# Patient Record
Sex: Female | Born: 1985 | Race: Black or African American | Hispanic: No | Marital: Single | Smoking: Former smoker
Health system: Southern US, Community
[De-identification: ages and names within clinical notes are randomized; demographics above are authoritative.]

## PROBLEM LIST (undated history)

## (undated) ENCOUNTER — Inpatient Hospital Stay (HOSPITAL_COMMUNITY): Payer: Self-pay

## (undated) DIAGNOSIS — E739 Lactose intolerance, unspecified: Secondary | ICD-10-CM

## (undated) DIAGNOSIS — IMO0002 Reserved for concepts with insufficient information to code with codable children: Secondary | ICD-10-CM

## (undated) DIAGNOSIS — I10 Essential (primary) hypertension: Secondary | ICD-10-CM

---
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
## 2006-02-28 ENCOUNTER — Emergency Department (HOSPITAL_COMMUNITY): Admission: EM | Admit: 2006-02-28 | Discharge: 2006-03-01 | Payer: Self-pay | Admitting: Emergency Medicine

## 2007-06-28 ENCOUNTER — Emergency Department (HOSPITAL_COMMUNITY): Admission: EM | Admit: 2007-06-28 | Discharge: 2007-06-28 | Payer: Self-pay | Admitting: Emergency Medicine

## 2007-07-01 ENCOUNTER — Emergency Department (HOSPITAL_COMMUNITY): Admission: EM | Admit: 2007-07-01 | Discharge: 2007-07-01 | Payer: Self-pay | Admitting: Emergency Medicine

## 2007-08-29 ENCOUNTER — Inpatient Hospital Stay (HOSPITAL_COMMUNITY): Admission: AD | Admit: 2007-08-29 | Discharge: 2007-08-29 | Payer: Self-pay | Admitting: Obstetrics & Gynecology

## 2007-09-22 ENCOUNTER — Inpatient Hospital Stay (HOSPITAL_COMMUNITY): Admission: AD | Admit: 2007-09-22 | Discharge: 2007-09-22 | Payer: Self-pay | Admitting: Obstetrics & Gynecology

## 2007-10-07 ENCOUNTER — Inpatient Hospital Stay (HOSPITAL_COMMUNITY): Admission: AD | Admit: 2007-10-07 | Discharge: 2007-10-07 | Payer: Self-pay | Admitting: Obstetrics

## 2007-10-08 ENCOUNTER — Ambulatory Visit (HOSPITAL_COMMUNITY): Admission: RE | Admit: 2007-10-08 | Discharge: 2007-10-08 | Payer: Self-pay | Admitting: Obstetrics & Gynecology

## 2007-12-06 ENCOUNTER — Ambulatory Visit (HOSPITAL_COMMUNITY): Admission: RE | Admit: 2007-12-06 | Discharge: 2007-12-06 | Payer: Self-pay | Admitting: Obstetrics & Gynecology

## 2007-12-23 ENCOUNTER — Inpatient Hospital Stay (HOSPITAL_COMMUNITY): Admission: AD | Admit: 2007-12-23 | Discharge: 2007-12-23 | Payer: Self-pay | Admitting: Psychiatry

## 2008-01-29 ENCOUNTER — Ambulatory Visit (HOSPITAL_COMMUNITY): Admission: RE | Admit: 2008-01-29 | Discharge: 2008-01-29 | Payer: Self-pay | Admitting: Obstetrics

## 2008-02-02 ENCOUNTER — Inpatient Hospital Stay (HOSPITAL_COMMUNITY): Admission: AD | Admit: 2008-02-02 | Discharge: 2008-02-02 | Payer: Self-pay | Admitting: Obstetrics

## 2008-02-17 ENCOUNTER — Ambulatory Visit (HOSPITAL_COMMUNITY): Admission: RE | Admit: 2008-02-17 | Discharge: 2008-02-17 | Payer: Self-pay | Admitting: Obstetrics

## 2008-03-17 ENCOUNTER — Ambulatory Visit (HOSPITAL_COMMUNITY): Admission: RE | Admit: 2008-03-17 | Discharge: 2008-03-17 | Payer: Self-pay | Admitting: Obstetrics & Gynecology

## 2008-04-10 ENCOUNTER — Other Ambulatory Visit: Payer: Self-pay | Admitting: Obstetrics & Gynecology

## 2008-04-13 ENCOUNTER — Encounter: Payer: Self-pay | Admitting: Obstetrics & Gynecology

## 2008-04-13 ENCOUNTER — Inpatient Hospital Stay (HOSPITAL_COMMUNITY): Admission: RE | Admit: 2008-04-13 | Discharge: 2008-04-16 | Payer: Self-pay | Admitting: Obstetrics & Gynecology

## 2008-08-13 ENCOUNTER — Emergency Department (HOSPITAL_COMMUNITY): Admission: EM | Admit: 2008-08-13 | Discharge: 2008-08-13 | Payer: Self-pay | Admitting: Family Medicine

## 2008-12-20 ENCOUNTER — Emergency Department (HOSPITAL_COMMUNITY): Admission: EM | Admit: 2008-12-20 | Discharge: 2008-12-20 | Payer: Self-pay | Admitting: Emergency Medicine

## 2009-03-30 ENCOUNTER — Emergency Department (HOSPITAL_COMMUNITY): Admission: EM | Admit: 2009-03-30 | Discharge: 2009-03-30 | Payer: Self-pay | Admitting: Family Medicine

## 2009-04-04 IMAGING — US US OB LIMITED
1 series · 14 of 19 positions shown · non-contrast
Comparison: none

OBSTETRICAL ULTRASOUND:
 This ultrasound exam was performed in the [HOSPITAL] Ultrasound Department.  The OB US report was generated in the AS system, and faxed to the ordering physician.  This report is also available in [REDACTED] PACS.

[Series 1: us ob limited · 0.31mm/px · 14 of 19 slices shown]
[im 1/19]
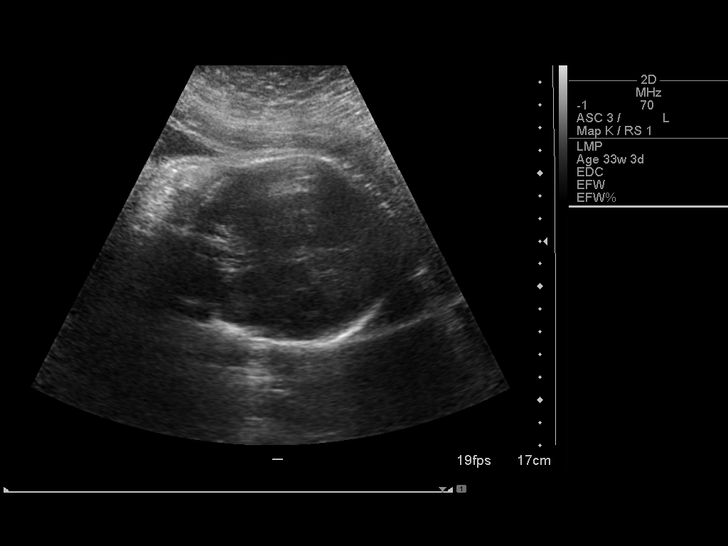
[im 3/19]
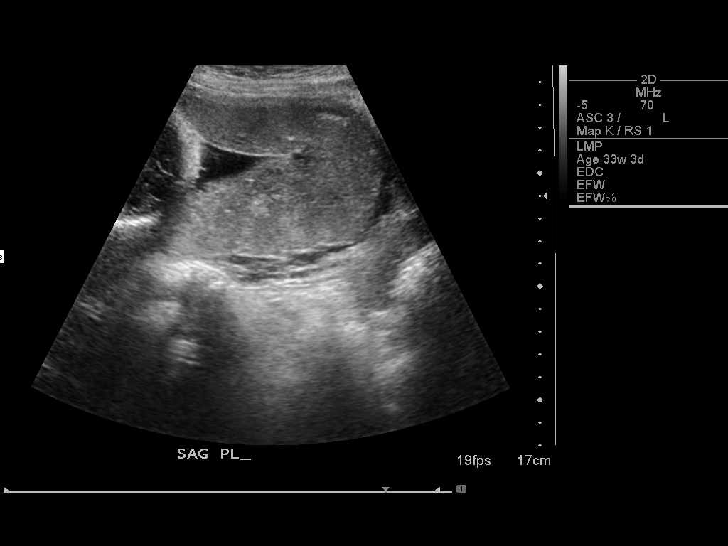
[im 4/19]
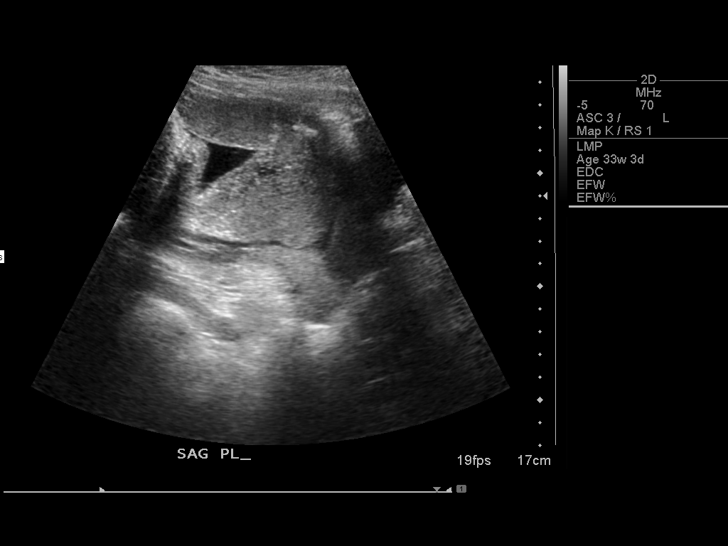
[im 5/19]
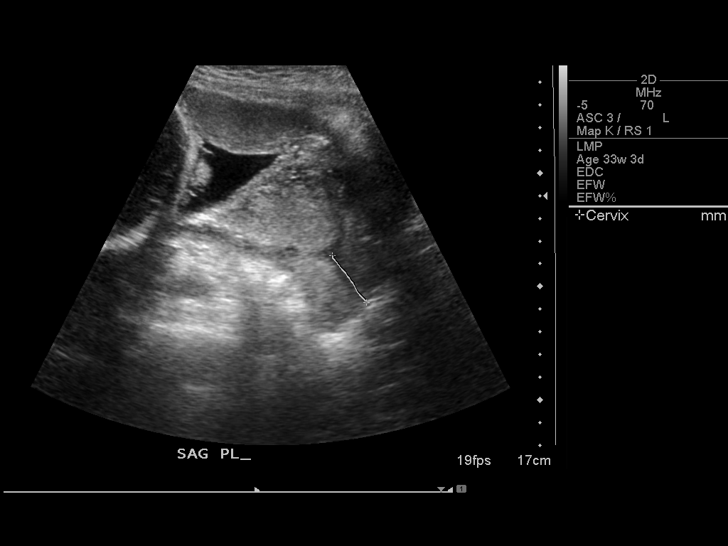
[im 7/19]
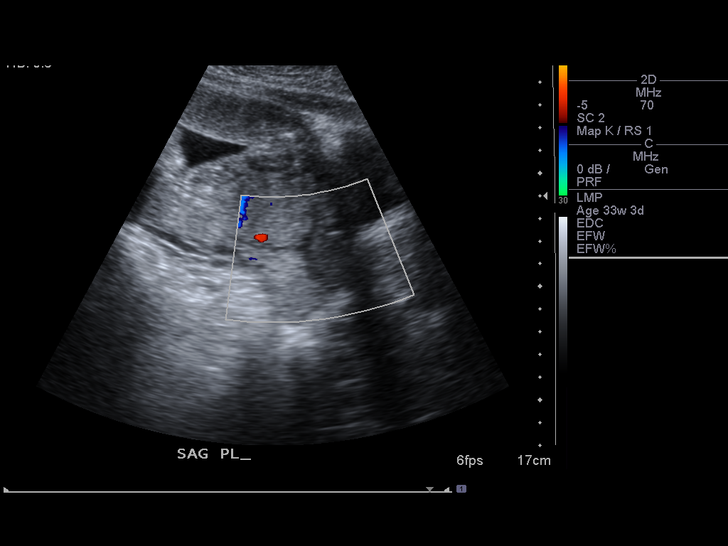
[im 8/19]
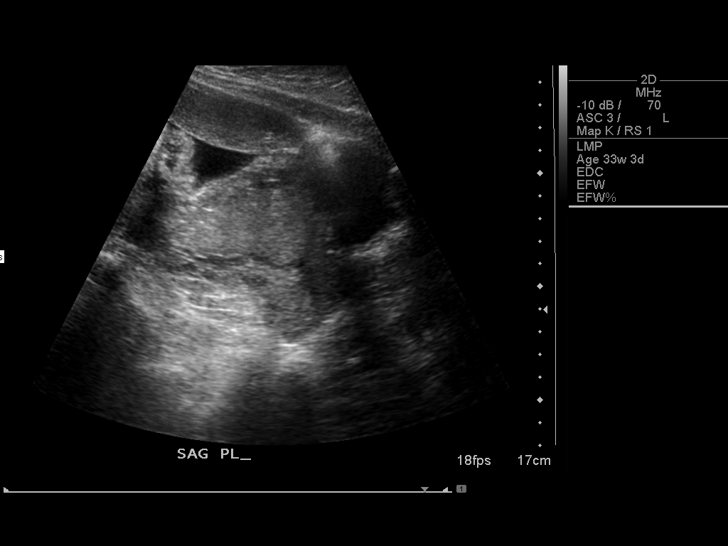
[im 9/19]
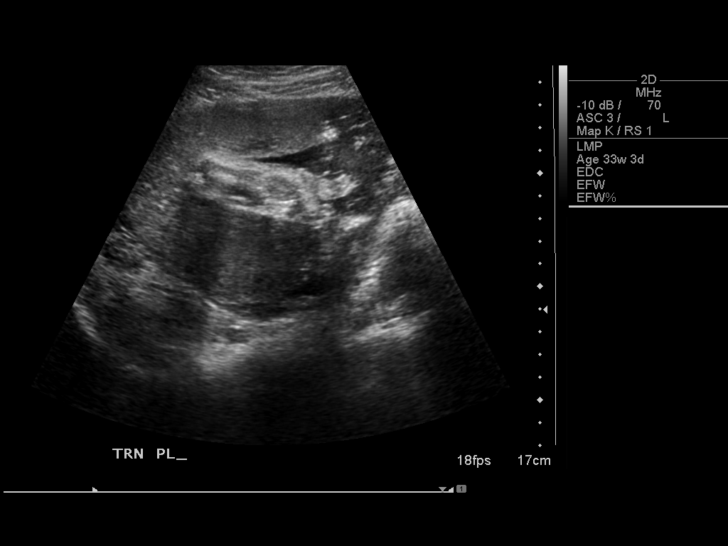
[im 11/19]
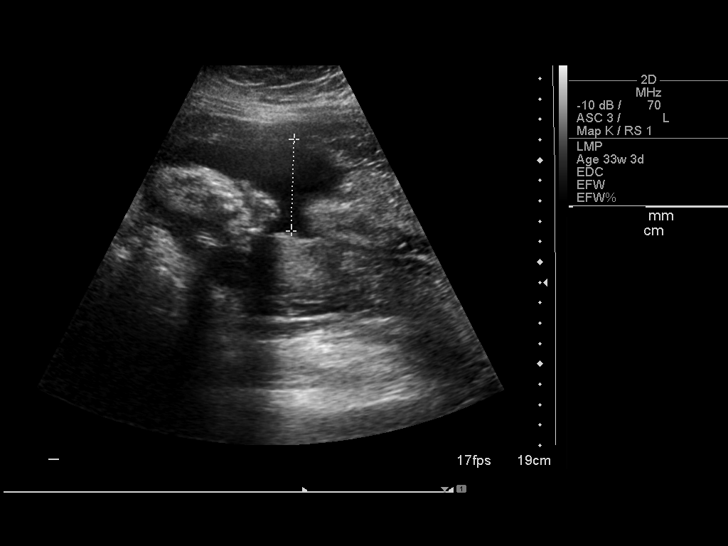
[im 12/19]
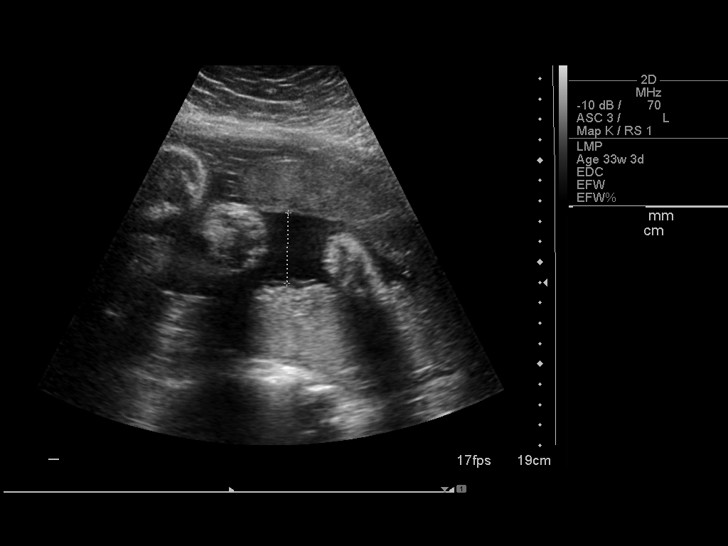
[im 13/19]
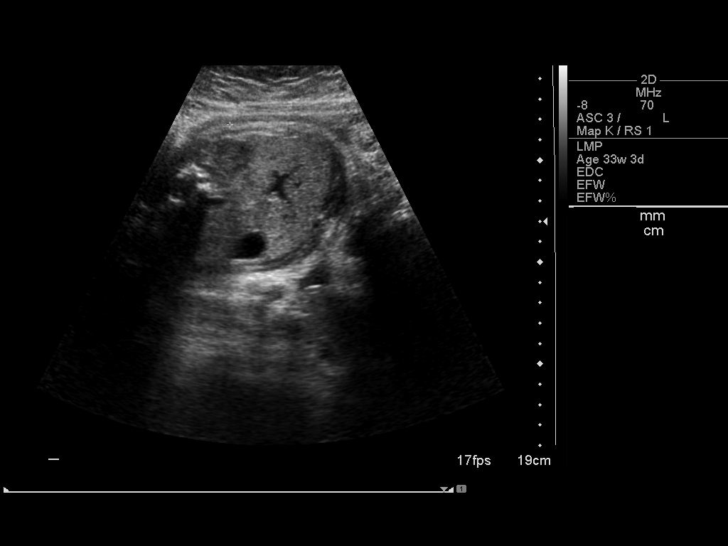
[im 15/19]
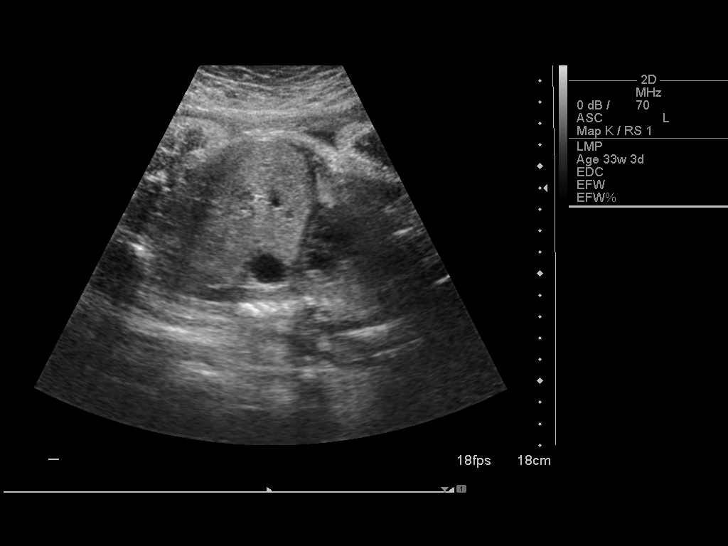
[im 16/19]
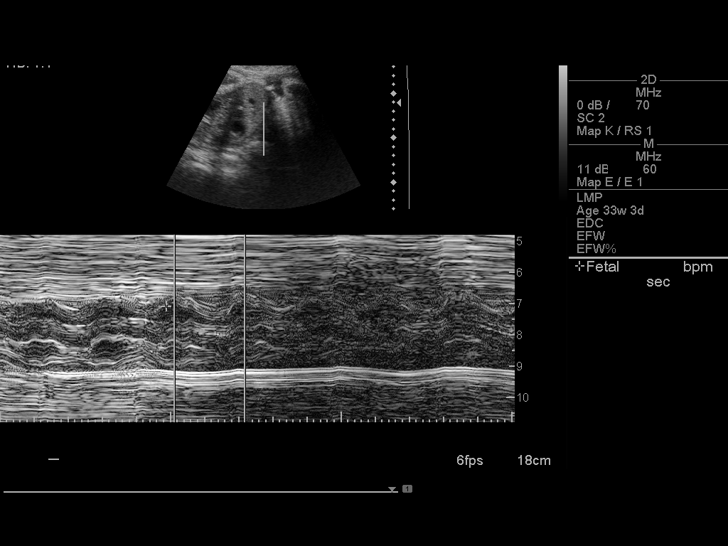
[im 17/19]
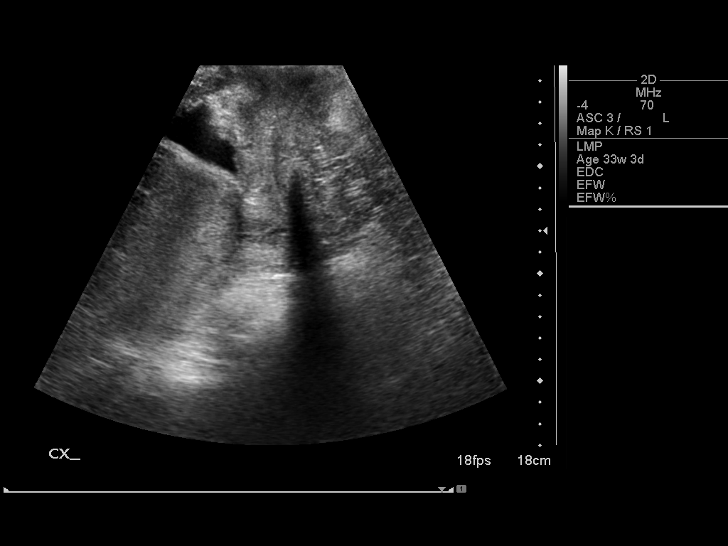
[im 19/19]
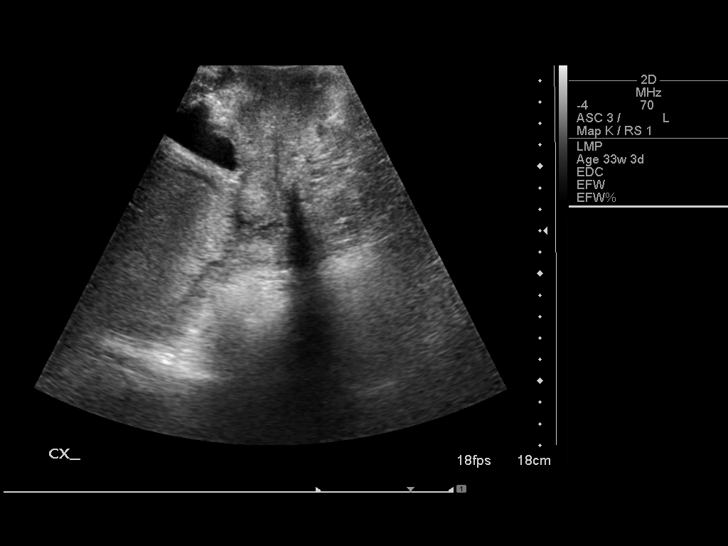

[14 of 19 positions shown; findings below may reference images not displayed]

IMPRESSION: See AS Obstetric US report.

## 2009-05-01 IMAGING — US US FETAL BPP W/O NONSTRESS
1 series · 6 of 6 positions shown · non-contrast
Comparison: none

OBSTETRICAL ULTRASOUND:
 This ultrasound exam was performed in the [HOSPITAL] Ultrasound Department.  The OB US report was generated in the AS system, and faxed to the ordering physician.  This report is also available in [REDACTED] PACS.

[Series 1: us fetal bpp w/o nonstress · 0.30mm/px · 6 acquisitions, 6 frames shown]
[im 1/6]
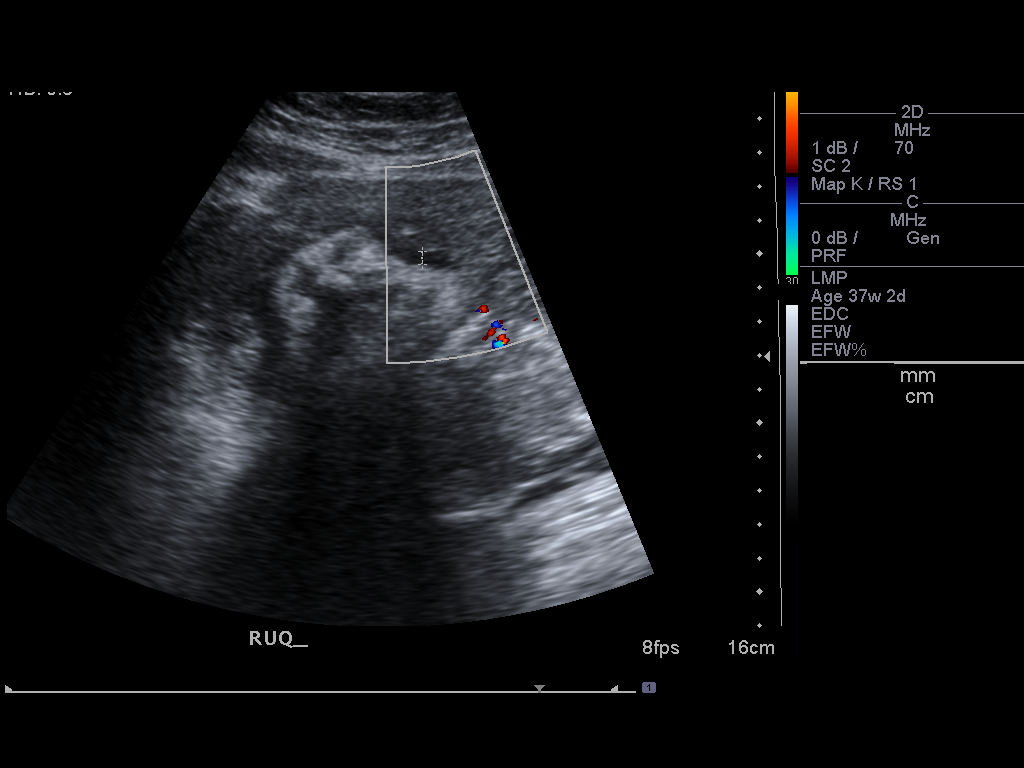
[im 2/6]
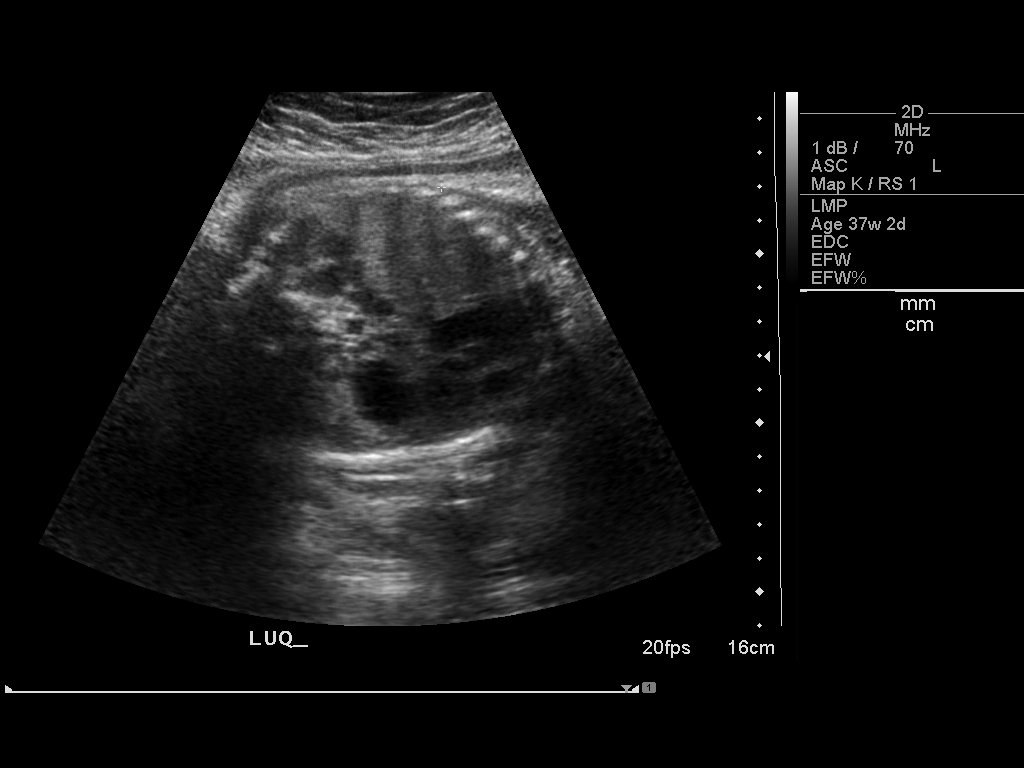
[im 3/6]
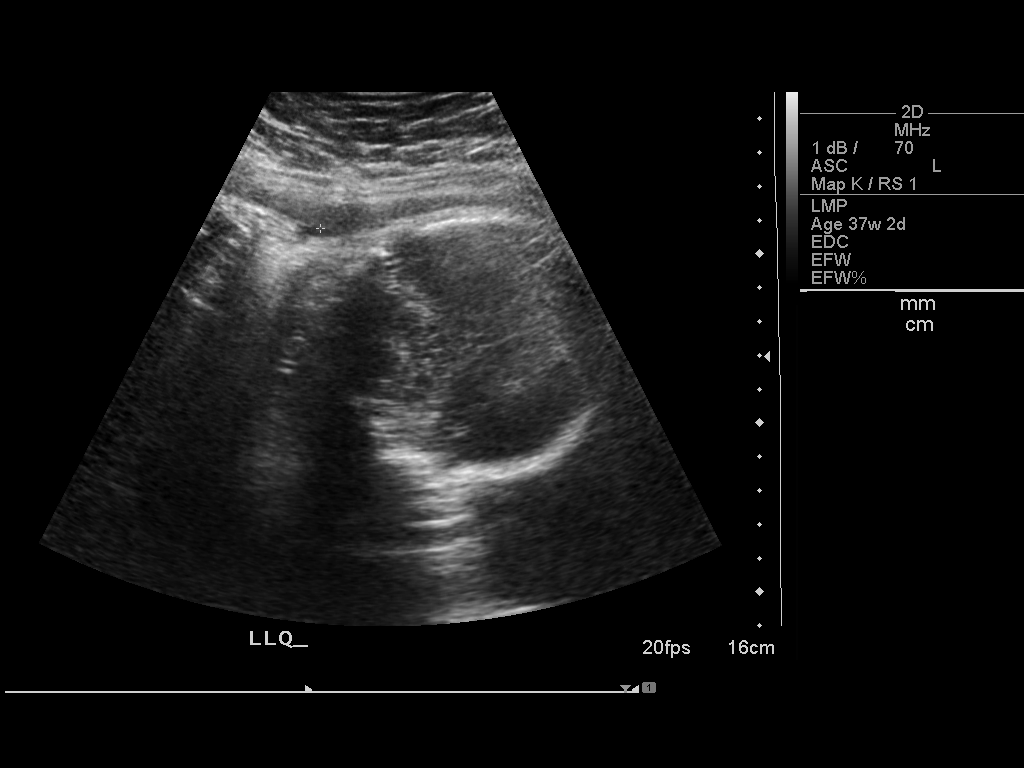
[im 4/6]
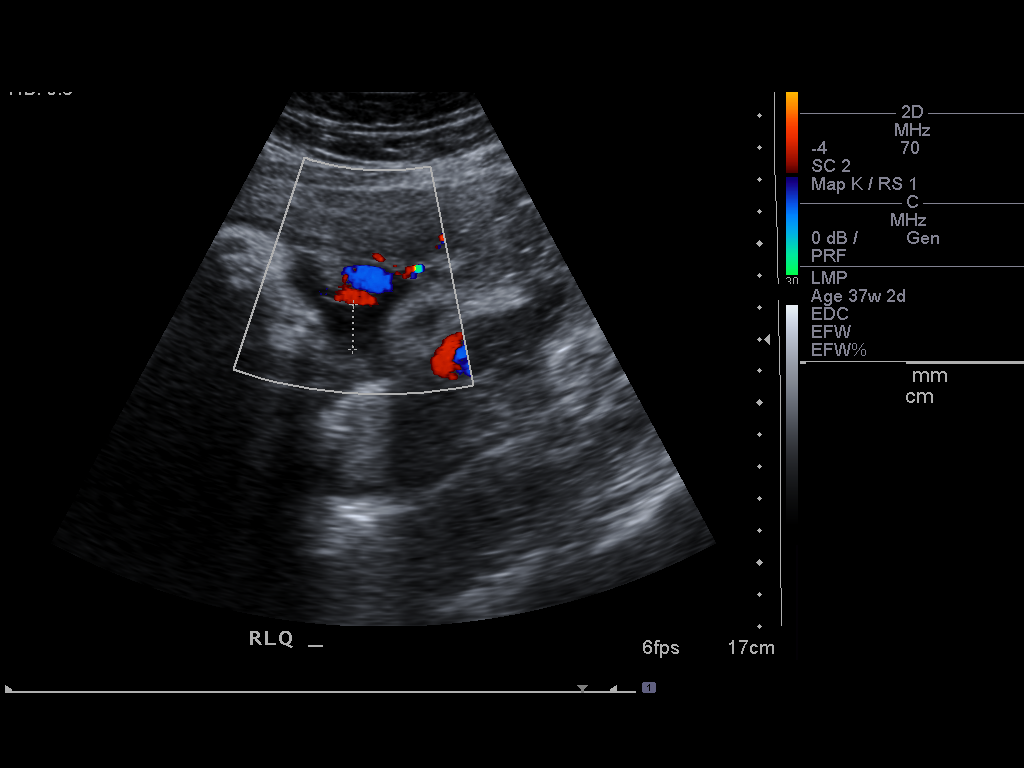
[im 5/6]
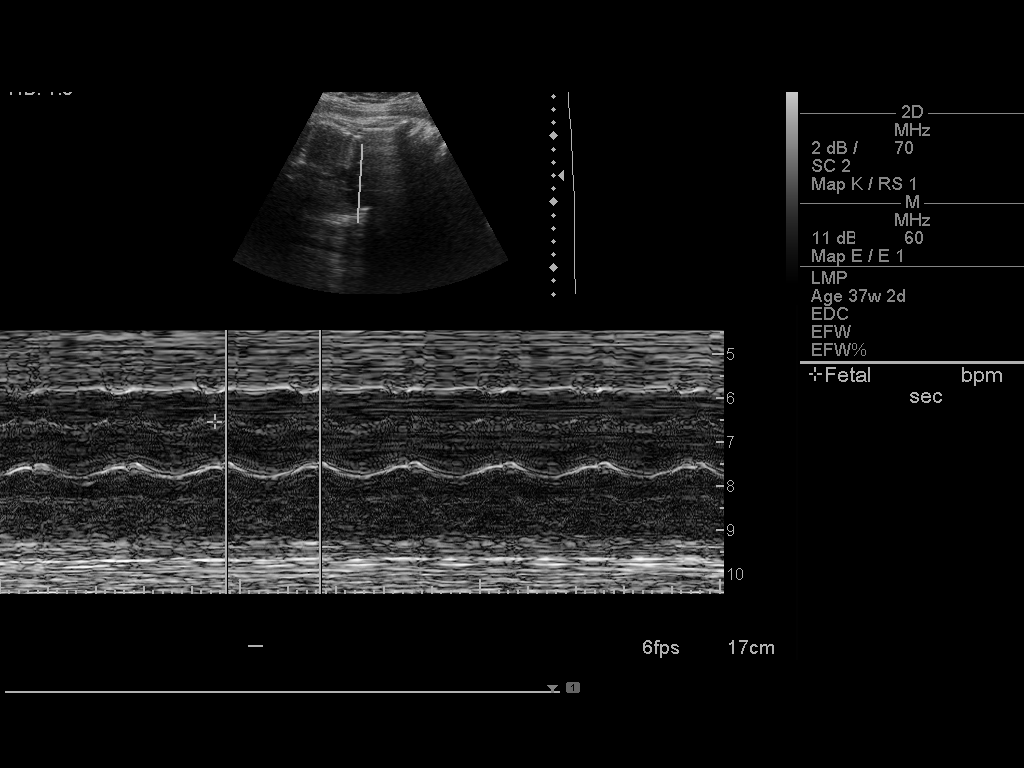
[im 6/6]
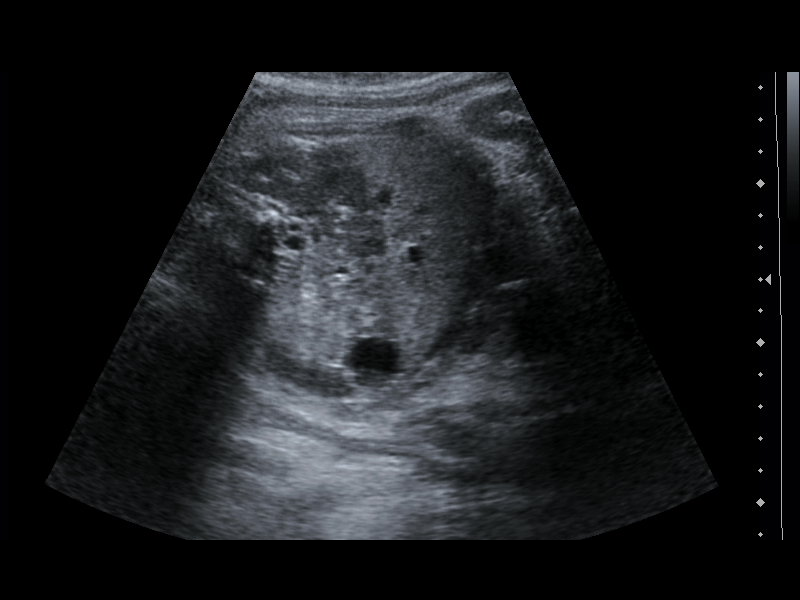

[6 of 6 positions shown; findings below may reference images not displayed]

IMPRESSION: See AS Obstetric US report.

## 2009-05-02 ENCOUNTER — Emergency Department (HOSPITAL_COMMUNITY): Admission: EM | Admit: 2009-05-02 | Discharge: 2009-05-02 | Payer: Self-pay | Admitting: Family Medicine

## 2010-05-23 ENCOUNTER — Encounter: Payer: Self-pay | Admitting: Obstetrics & Gynecology

## 2010-07-17 LAB — POCT PREGNANCY, URINE: Preg Test, Ur: NEGATIVE

## 2010-08-03 LAB — POCT URINALYSIS DIP (DEVICE)
Bilirubin Urine: NEGATIVE
Hgb urine dipstick: NEGATIVE
Protein, ur: NEGATIVE mg/dL
Specific Gravity, Urine: 1.02 (ref 1.005–1.030)
Urobilinogen, UA: 1 mg/dL (ref 0.0–1.0)

## 2010-08-03 LAB — URINE CULTURE

## 2010-08-03 LAB — POCT PREGNANCY, URINE: Preg Test, Ur: NEGATIVE

## 2010-08-10 LAB — POCT URINALYSIS DIP (DEVICE)
Glucose, UA: NEGATIVE mg/dL
Protein, ur: 30 mg/dL — AB
Specific Gravity, Urine: 1.01 (ref 1.005–1.030)
Urobilinogen, UA: 0.2 mg/dL (ref 0.0–1.0)
pH: 8.5 — ABNORMAL HIGH (ref 5.0–8.0)

## 2010-08-10 LAB — POCT PREGNANCY, URINE: Preg Test, Ur: NEGATIVE

## 2010-09-13 NOTE — H&P (Signed)
Leslie Mills, Leslie Mills               ACCOUNT NO.:  [id number]   MEDICAL RECORD NO.:  [id number]          PATIENT TYPE:  INP   LOCATION:  9170                          FACILITY:  [facility]   PHYSICIAN:  Roseanna Rainbow, M.D.DATE OF BIRTH:  Nov 28, 1985   DATE OF ADMISSION:  04/13/2008  DATE OF DISCHARGE:                              HISTORY & PHYSICAL   CHIEF COMPLAINT:  The patient is a 25 year old para 0 with an estimated  date of confinement of May 02, 2008 with an intrauterine pregnancy at  37 weeks with a placenta previa and oligohydramnios.   HISTORY OF PRESENT ILLNESS:  The patient had presented to the radiology  suite for an amniocentesis for fetal lung maturity.  However, an  adequate pocket of fluid was failed to be demonstrated.  The amniotic  fluid index was 3.8.   MEDICATIONS:  Please see the medication reconciliation form.   OB RISK FACTORS:  Placenta previa.   PAST OBSTETRICAL HISTORY:  There is a history of a voluntary termination  of pregnancy.  Borderline elevated blood pressures, rule out gestational  hypertension.   PRENATAL LABS:  Blood type is A positive.  Antibody screen negative and  urine culture and sensitivity no growth,  1-hour GTT 104.  Gardnerella  vaginalis positive in June of 2009.  Hepatitis B surface antigen  negative.  Hematocrit 35.1, hemoglobin 11.7.  HIV nonreactive.  The  platelets 249,000.  RPR nonreactive, rubella immune.  Sickle cell  negative.  Ultrasound on November 17, central previa amniotic fluid  index subjectively low normal of AFI 10.8.  This was a limited study.  Normal cervical length.   PAST GYN HISTORY:  Noncontributory.   PAST MEDICAL HISTORY:  No significant history of medical diseases.   PAST SURGICAL HISTORY:  No previous surgery.   SOCIAL HISTORY:  She works at [employer].  She is single, does not give  any significant history of alcohol usage, has no significant smoking  history.  Denies illicit drug use.   FAMILY HISTORY:  Remarkable for adult-onset diabetes and hypertension.   PHYSICAL EXAMINATION:  VITAL SIGNS:  To be assessed once the patient  reaches the labor and delivery suite.  For an ultrasound findings please  see the above.  GENERAL:  Well-developed, well-nourished, no apparent distress.  ABDOMEN:  Gravid, nontender.  EXTREMITIES:  1+ pretibial edema.  PELVIC EXAM:  Deferred.   ASSESSMENT:  Intrauterine pregnancy at 37 weeks with a placenta previa  and now with oligohydramnios.   PLAN:  Admission, cesarean delivery.      Roseanna Rainbow, M.D.  Electronically Signed     LAJ/MEDQ  D:  04/13/2008  T:  04/13/2008  Job:  [id number]

## 2010-09-13 NOTE — Op Note (Signed)
NAMEETTEL, ALBERGO               ACCOUNT NO.:  [id number]   MEDICAL RECORD NO.:  [id number]          PATIENT TYPE:  INP   LOCATION:  9118                          FACILITY:  [facility]   PHYSICIAN:  Roseanna Rainbow, M.D.DATE OF BIRTH:  [birth year]   DATE OF PROCEDURE:  04/13/2008  DATE OF DISCHARGE:                               OPERATIVE REPORT   PREOPERATIVE DIAGNOSES:  Intrauterine pregnancy at 37 plus weeks.  Oligohydramnios. Placenta previa   POSTOPERATIVE DIAGNOSES:  Intrauterine pregnancy at 37 plus weeks.  Oligohydramnios, placenta previa , double footling breech presentation.   PROCEDURES:  Primary lower uterine elliptical cesarean delivery via  Pfannenstiel skin incision.   SURGEON:  Roseanna Rainbow, M.D.   ANESTHESIA:  Spinal.   ESTIMATED BLOOD LOSS:  600 mL.   COMPLICATIONS:  None.   IV FLUIDS:  As per anesthesiology.   URINE OUTPUT:  As per anesthesiology.   PROCEDURE:  The patient was taken to the operating room with IV running.  She was placed in the dorsal supine position with a leftward tilt and  prepped and draped in the usual sterile fashion.  After a time-out had  been completed, a Pfannenstiel skin incision was made with a scalpel.  This incision was carried down through the fascia.  The fascia was  nicked in the midline.  Fascia was then extended bilaterally.  The  superior aspect of the fascial incision was tented up and the underlying  rectus muscles dissected off.  The inferior aspect of the fascial  incision was manipulated in a similar fashion.  The rectus muscles were  separated in the midline.  The parietoperitoneum was entered bluntly.  The excision was extended bluntly.  The bladder blade was then placed.  The vesicouterine peritoneum was tented up and entered sharply.  This  incision was extended bilaterally and the bladder flap created bluntly.  The bladder blade was then in place.  The lower uterine segment was  incised in a  transverse fashion with the scalpel.  This incision was  then extended bluntly.  A complete breech extraction was then performed.  To facilitate delivery of the head Mariceau-Snellie-Veit maneuver was  performed.  The cord was clamped and cut.  The infant was handed off to  the awaiting neonatologist.  The placenta was then removed.  The  intrauterine cavity was evacuated of the remaining amniotic fluid, clots  and debris with a moist laparotomy sponge.  The uterine incision was  then reapproximated in running and locking fashion using a suture of 0  Monocryl.  A second imbricating layer of the same suture was then  placed.  Bleeding point was then secured with figure-of-eight sutures of  2-0 Vicryl.  The pericolic gutters were then copiously irrigated.  The  parietoperitoneum was closed in a running fashion using suture  of 2-0 Vicryl. The fascia was closed with two sutures meeting in the  midline in a running fashion.  The skin was closed with staples.  At the  close of the procedure, the instrument and pack counts were noted to be  correct x2.  The  patient was taken to the PACU awake and in stable  condition.      Roseanna Rainbow, M.D.  Electronically Signed     LAJ/MEDQ  D:  04/13/2008  T:  04/14/2008  Job:  [id number]

## 2010-09-16 NOTE — Discharge Summary (Signed)
Leslie Mills, Leslie Mills               ACCOUNT NO.:  [id number]   MEDICAL RECORD NO.:  [id number]          PATIENT TYPE:  INP   LOCATION:  9118                          FACILITY:  [facility]   PHYSICIAN:  Charles A. Clearance Coots, M.D.DATE OF BIRTH:  03-Jan-1986   DATE OF ADMISSION:  04/13/2008  DATE OF DISCHARGE:  04/16/2008                               DISCHARGE SUMMARY   ADMITTING DIAGNOSES:  A [redacted] weeks gestation, placenta previa,  oligohydramnios.   DISCHARGE DIAGNOSES:  A [redacted] weeks gestation, placenta previa,  oligohydramnios, status post primary low transverse cesarean section on  April 13, 2008.  Viable female was delivered at 12:32, Apgars of 7 at 1  minute 9 at 5 minutes, weight of 26, 55 g, length of 48.90 cm.  Mother  and infant was home in good condition.   REASON FOR ADMISSION:  A 25 year old para 0 estimated date of  confinement of May 02, 2008 presents at 42 weeks' gestation with  placenta previa and oligohydramnios.  The patient had presented to the  radiology suite for amniocentesis for fetal lung maturity.  However, an  adequate pocket of fluid could not be demonstrated.  The amniotic fluid  index was 3.8 cm.  A decision was made to proceed with cesarean section  delivery for placenta previa and oligohydramnios.   PAST MEDICAL HISTORY:   SURGERY:  None.   ILLNESSES:  None.   MEDICATIONS:  Prenatal vitamins.   ALLERGIES:  No known drug allergies.   SOCIAL HISTORY:  Single.  Negative tobacco, alcohol, or recreational  drug use.   FAMILY HISTORY:  Remarkable for diabetes, hypertension.   PHYSICAL EXAMINATION:  GENERAL:  Well-nourished, well-developed female  in no acute distress.  She is afebrile.  VITAL SIGNS:  Stable.  LUNGS:  Clear to auscultation bilaterally.  HEART:  Regular rate and rhythm.  ABDOMEN:  Gravid, nontender.  PELVIC:  Exam was omitted.   ADMITTING LABS:  Hemoglobin 11, hematocrit 33, white blood cell count  13,300, platelets 242,000.  Comprehensive  metabolic panel was within  normal limits.  RPR was nonreactive.   HOSPITAL COURSE:  The patient underwent primary low transverse cesarean  section on April 13, 2008.  There were no intraoperative  complications.  Postoperative course was uncomplicated, and she was  discharged home on postop day #3 in good condition.   DISCHARGE LABS:  Hemoglobin 8.4, hematocrit 23.9, white blood cell count  13,700, platelets 188,000.   DISCHARGE DISPOSITION:  Medications continue prenatal vitamins.  Iron  was prescribed for anemia.  Percocet and ibuprofen was prescribed for  pain.  Routine written instructions were given for discharge after  cesarean section.  The patient is to call office for followup  appointment in 2 weeks.      Charles A. Clearance Coots, M.D.  Electronically Signed     CAH/MEDQ  D:  06/09/2008  T:  06/10/2008  Job:  [id number]

## 2010-10-11 ENCOUNTER — Emergency Department (HOSPITAL_COMMUNITY): Payer: Self-pay

## 2010-10-11 ENCOUNTER — Emergency Department (HOSPITAL_COMMUNITY)
Admission: EM | Admit: 2010-10-11 | Discharge: 2010-10-11 | Disposition: A | Discharge status: Home / Self Care | Payer: Self-pay | Attending: Emergency Medicine | Admitting: Emergency Medicine

## 2010-10-11 DIAGNOSIS — F17200 Nicotine dependence, unspecified, uncomplicated: Secondary | ICD-10-CM | POA: Insufficient documentation

## 2010-10-11 DIAGNOSIS — R071 Chest pain on breathing: Secondary | ICD-10-CM | POA: Insufficient documentation

## 2010-10-11 DIAGNOSIS — M25519 Pain in unspecified shoulder: Secondary | ICD-10-CM | POA: Insufficient documentation

## 2010-10-11 DIAGNOSIS — K219 Gastro-esophageal reflux disease without esophagitis: Secondary | ICD-10-CM | POA: Insufficient documentation

## 2011-01-20 LAB — POCT PREGNANCY, URINE
Operator id: 294501
Preg Test, Ur: NEGATIVE

## 2011-01-20 LAB — URINALYSIS, ROUTINE W REFLEX MICROSCOPIC
Bilirubin Urine: NEGATIVE
Glucose, UA: NEGATIVE
Specific Gravity, Urine: 1.019
Urobilinogen, UA: 0.2

## 2011-01-20 LAB — URINE MICROSCOPIC-ADD ON

## 2011-01-20 LAB — URINE CULTURE: Colony Count: >=100000

## 2011-01-24 LAB — POCT PREGNANCY, URINE: Preg Test, Ur: POSITIVE

## 2011-01-24 LAB — WET PREP, GENITAL
Clue Cells Wet Prep HPF POC: NONE SEEN
Trich, Wet Prep: NONE SEEN
Yeast Wet Prep HPF POC: NONE SEEN

## 2011-01-24 LAB — URINE MICROSCOPIC-ADD ON: RBC / HPF: NONE SEEN

## 2011-01-24 LAB — HCG, QUANTITATIVE, PREGNANCY: hCG, Beta Chain, Quant, S: 12516 — ABNORMAL HIGH

## 2011-01-24 LAB — URINALYSIS, ROUTINE W REFLEX MICROSCOPIC
Nitrite: POSITIVE — AB
Urobilinogen, UA: 0.2
pH: 5.5

## 2011-01-24 LAB — URINE CULTURE

## 2011-01-24 LAB — CBC
Platelets: 293
WBC: 12.7 — ABNORMAL HIGH

## 2011-01-24 LAB — ABO/RH: ABO/RH(D): A POS

## 2011-01-30 LAB — WET PREP, GENITAL
WBC, Wet Prep HPF POC: NONE SEEN
Yeast Wet Prep HPF POC: NONE SEEN

## 2011-01-30 LAB — URINALYSIS, ROUTINE W REFLEX MICROSCOPIC
Bilirubin Urine: NEGATIVE
Nitrite: NEGATIVE
Specific Gravity, Urine: 1.015
pH: 7.5

## 2011-01-30 LAB — URINE MICROSCOPIC-ADD ON

## 2011-02-03 LAB — COMPREHENSIVE METABOLIC PANEL
ALT: 20 U/L (ref 0–35)
Alkaline Phosphatase: 157 U/L — ABNORMAL HIGH (ref 39–117)
CO2: 23 mEq/L (ref 19–32)
Chloride: 106 mEq/L (ref 96–112)
GFR calc non Af Amer: >60 mL/min (ref >60)
Glucose, Bld: 91 mg/dL (ref 70–99)
Potassium: 3.8 mEq/L (ref 3.5–5.1)
Sodium: 134 mEq/L — ABNORMAL LOW (ref 135–145)

## 2011-02-03 LAB — CBC
HCT: 33 % — ABNORMAL LOW (ref 36.0–46.0)
MCHC: 34.8 g/dL (ref 30.0–36.0)
MCHC: 34.9 g/dL (ref 30.0–36.0)
MCV: 95.8 fL (ref 78.0–100.0)
MCV: 95.9 fL (ref 78.0–100.0)
MCV: 97 fL (ref 78.0–100.0)
Platelets: 242 10*3/uL (ref 150–400)
Platelets: 244 10*3/uL (ref 150–400)
RDW: 13.3 % (ref 11.5–15.5)
RDW: 13.5 % (ref 11.5–15.5)
RDW: 13.6 % (ref 11.5–15.5)
WBC: 13.3 10*3/uL — ABNORMAL HIGH (ref 4.0–10.5)

## 2011-02-03 LAB — LACTATE DEHYDROGENASE: LDH: 125 U/L (ref 94–250)

## 2011-02-03 LAB — RPR: RPR Ser Ql: NONREACTIVE

## 2011-05-30 ENCOUNTER — Emergency Department (HOSPITAL_COMMUNITY)
Admission: EM | Admit: 2011-05-30 | Discharge: 2011-05-30 | Disposition: A | Discharge status: Home / Self Care | Payer: Self-pay | Source: Home / Self Care | Attending: Emergency Medicine | Admitting: Emergency Medicine

## 2011-05-30 NOTE — ED Notes (Signed)
Pt called x 2 

## 2011-05-30 NOTE — ED Notes (Signed)
Called pt x 1

## 2011-05-30 NOTE — ED Notes (Signed)
Pt called x 3 no answer 

## 2011-05-30 NOTE — ED Provider Notes (Signed)
History     CSN: [id number]  Arrival date & time 05/30/11  1027   First MD Initiated Contact with Patient 05/30/11 1029      No chief complaint on file.   (Consider location/radiation/quality/duration/timing/severity/associated sxs/prior treatment) HPI  No past medical history on file.  No past surgical history on file.  No family history on file.  History  Substance Use Topics  . Smoking status: Not on file  . Smokeless tobacco: Not on file  . Alcohol Use: Not on file    OB History    No data available      Review of Systems  Allergies  Review of patient's allergies indicates not on file.  Home Medications  No current outpatient prescriptions on file.  There were no vitals taken for this visit.  Physical Exam  ED Course  Procedures (including critical care time)  Labs Reviewed - No data to display No results found.   No diagnosis found.    MDM  lwbs        Jimmie Molly, MD 05/30/11 [phone]

## 2012-07-25 ENCOUNTER — Other Ambulatory Visit: Payer: Self-pay | Admitting: *Deleted

## 2012-07-25 DIAGNOSIS — IMO0001 Reserved for inherently not codable concepts without codable children: Secondary | ICD-10-CM

## 2012-07-25 DIAGNOSIS — N390 Urinary tract infection, site not specified: Secondary | ICD-10-CM

## 2012-07-25 MED ORDER — NORETHIN ACE-ETH ESTRAD-FE 1-20 MG-MCG(24) PO CHEW: 1.0000 mg | CHEWABLE_TABLET | Freq: Every day | ORAL | Status: DC

## 2012-07-25 MED ORDER — SULFAMETHOXAZOLE-TRIMETHOPRIM 800-160 MG PO TABS: 1.0000 | ORAL_TABLET | Freq: Two times a day (BID) | ORAL | Status: DC

## 2012-07-25 NOTE — Telephone Encounter (Signed)
Forgot to enter pharmacy- Rx reordered

## 2012-07-25 NOTE — Telephone Encounter (Signed)
Pt called to request Rx for UTI- is experiencing burning with urination and urgency. Pt also request Rf on her birth control. Pt states she is up to date on her annual exam. Rx called to pharmacy per office protocol.

## 2012-11-13 ENCOUNTER — Encounter (HOSPITAL_COMMUNITY): Payer: Self-pay

## 2012-11-13 ENCOUNTER — Inpatient Hospital Stay (HOSPITAL_COMMUNITY): Payer: Medicaid Other

## 2012-11-13 ENCOUNTER — Inpatient Hospital Stay (HOSPITAL_COMMUNITY)
Admission: AD | Admit: 2012-11-13 | Discharge: 2012-11-13 | Disposition: A | Discharge status: Home / Self Care | Payer: Medicaid Other | Source: Ambulatory Visit | Attending: Obstetrics & Gynecology | Admitting: Obstetrics & Gynecology

## 2012-11-13 DIAGNOSIS — N760 Acute vaginitis: Secondary | ICD-10-CM

## 2012-11-13 DIAGNOSIS — B9689 Other specified bacterial agents as the cause of diseases classified elsewhere: Secondary | ICD-10-CM | POA: Insufficient documentation

## 2012-11-13 DIAGNOSIS — A499 Bacterial infection, unspecified: Secondary | ICD-10-CM | POA: Insufficient documentation

## 2012-11-13 DIAGNOSIS — O2390 Unspecified genitourinary tract infection in pregnancy, unspecified trimester: Secondary | ICD-10-CM | POA: Insufficient documentation

## 2012-11-13 DIAGNOSIS — R109 Unspecified abdominal pain: Secondary | ICD-10-CM | POA: Insufficient documentation

## 2012-11-13 DIAGNOSIS — N949 Unspecified condition associated with female genital organs and menstrual cycle: Secondary | ICD-10-CM | POA: Insufficient documentation

## 2012-11-13 DIAGNOSIS — O9989 Other specified diseases and conditions complicating pregnancy, childbirth and the puerperium: Secondary | ICD-10-CM

## 2012-11-13 DIAGNOSIS — O26891 Other specified pregnancy related conditions, first trimester: Secondary | ICD-10-CM

## 2012-11-13 HISTORY — DX: Lactose intolerance, unspecified: E73.9

## 2012-11-13 HISTORY — DX: Essential (primary) hypertension: I10

## 2012-11-13 LAB — OB RESULTS CONSOLE GC/CHLAMYDIA
Chlamydia: NEGATIVE
Gonorrhea: NEGATIVE

## 2012-11-13 LAB — WET PREP, GENITAL
WBC, Wet Prep HPF POC: NONE SEEN
Yeast Wet Prep HPF POC: NONE SEEN

## 2012-11-13 LAB — URINALYSIS, ROUTINE W REFLEX MICROSCOPIC
Hgb urine dipstick: NEGATIVE
Ketones, ur: NEGATIVE mg/dL
Leukocytes, UA: NEGATIVE
Protein, ur: NEGATIVE mg/dL
Urobilinogen, UA: 0.2 mg/dL (ref 0.0–1.0)

## 2012-11-13 LAB — CBC
Platelets: 281 10*3/uL (ref 150–400)
RBC: 3.91 MIL/uL (ref 3.87–5.11)
WBC: 13.4 10*3/uL — ABNORMAL HIGH (ref 4.0–10.5)

## 2012-11-13 LAB — POCT PREGNANCY, URINE: Preg Test, Ur: POSITIVE — AB

## 2012-11-13 MED ORDER — METRONIDAZOLE 500 MG PO TABS: 500.0000 mg | ORAL_TABLET | Freq: Two times a day (BID) | ORAL | Status: DC

## 2012-11-13 NOTE — MAU Note (Signed)
Patient states she had a positive home pregnancy test 2 days ago. Has been having abdominal "throbbing" like period trying to start. Has a vaginal discharge with odor. Denies bleeding, nausea or vomiting.

## 2012-11-13 NOTE — MAU Provider Note (Signed)
History     CSN: [id number]  Arrival date and time: 11/13/12 1347   None     Chief Complaint  Patient presents with  . Possible Pregnancy  . Abdominal Pain  . Vaginal Discharge   HPI  Pt is a G1P0 here with report of lower pelvis cramping.  Feels like throbbing and menstruation is about to start.  +vaginal discharge with odor.  Denies bleeding, nausea or vomiting. Patient's last menstrual period was 10/08/2012.    No past medical history on file.  No past surgical history on file.  No family history on file.  History  Substance Use Topics  . Smoking status: Not on file  . Smokeless tobacco: Not on file  . Alcohol Use: Not on file    Allergies: Allergies not on file  Prescriptions prior to admission  Medication Sig Dispense Refill  . Norethin Ace-Eth Estrad-FE (MINASTRIN 24 FE) 1-20 MG-MCG(24) CHEW Chew 1-20 mg by mouth daily.  28 tablet  5  . sulfamethoxazole-trimethoprim (BACTRIM DS,SEPTRA DS) 800-160 MG per tablet Take 1 tablet by mouth 2 (two) times daily.  6 tablet  0    Review of Systems  Gastrointestinal: Positive for abdominal pain.  Genitourinary:       Vaginal discharge  All other systems reviewed and are negative.   Physical Exam   Blood pressure 136/73, pulse 105, temperature 98.5 F (36.9 C), temperature source Oral, resp. rate 16, height 5\' 6"  (1.676 m), weight 90.084 kg (198 lb 9.6 oz), last menstrual period 10/08/2012, SpO2 100.00%.  Physical Exam  Constitutional: She is oriented to person, place, and time. She appears well-developed and well-nourished. No distress.  HENT:  Head: Normocephalic.  Neck: Normal range of motion. Neck supple.  Cardiovascular: Normal rate, regular rhythm and normal heart sounds.   Respiratory: Effort normal and breath sounds normal. No respiratory distress.  GI: Soft. There is no tenderness. There is no CVA tenderness.  Genitourinary: Uterus is enlarged. Cervix exhibits no motion tenderness and no discharge.  Vaginal discharge (white, creamy) found.  Musculoskeletal: Normal range of motion.  Neurological: She is alert and oriented to person, place, and time.  Skin: Skin is warm and dry.  Psychiatric: She has a normal mood and affect.    MAU Course  Procedures  Results for orders placed during the hospital encounter of 11/13/12 (from the past 24 hour(s))  URINALYSIS, ROUTINE W REFLEX MICROSCOPIC     Status: None   Collection Time    11/13/12  2:05 PM      Result Value Range   Color, Urine YELLOW  YELLOW   APPearance CLEAR  CLEAR   Specific Gravity, Urine 1.025  1.005 - 1.030   pH 6.0  5.0 - 8.0   Glucose, UA NEGATIVE  NEGATIVE mg/dL   Hgb urine dipstick NEGATIVE  NEGATIVE   Bilirubin Urine NEGATIVE  NEGATIVE   Ketones, ur NEGATIVE  NEGATIVE mg/dL   Protein, ur NEGATIVE  NEGATIVE mg/dL   Urobilinogen, UA 0.2  0.0 - 1.0 mg/dL   Nitrite NEGATIVE  NEGATIVE   Leukocytes, UA NEGATIVE  NEGATIVE  POCT PREGNANCY, URINE     Status: Abnormal   Collection Time    11/13/12  2:11 PM      Result Value Range   Preg Test, Ur POSITIVE (*) NEGATIVE   Ultrasound: Intrauterine gestational sac: Visualized/normal in shape.  Yolk sac: Present  Embryo: None  MSD: 5.5 mm 5 w 1 d [place] EDC: 07/15/2013  Maternal uterus/adnexae:  Right  ovary measures 3.4 x 2.8 x 2.3 cm. Probable corpus luteum  within the right ovary. Normal appearance of the left ovary  measuring 3.6 x 2.4 x 2.0 cm. No significant subchorionic  hemorrhage. No significant free fluid.  IMPRESSION:  Single intrauterine pregnancy. Gestational age by ultrasound is 5  weeks and 1 day.  Assessment and Plan  Bacterial Vaginosis Abdominal Pain in Early Pregnancy  Plan: DC to home RX Flagyl 500 mg BID x 7 days #14 Schedule prenatal appointment with [facility] as desired.    [facility] 11/13/2012, 3:30 PM

## 2012-11-26 ENCOUNTER — Encounter: Payer: Self-pay | Admitting: Obstetrics

## 2012-12-02 ENCOUNTER — Encounter: Payer: Self-pay | Admitting: Obstetrics & Gynecology

## 2012-12-02 ENCOUNTER — Ambulatory Visit (INDEPENDENT_AMBULATORY_CARE_PROVIDER_SITE_OTHER): Payer: Medicaid Other | Admitting: Obstetrics & Gynecology

## 2012-12-02 VITALS — BP 127/88 | Temp 98.6°F | Wt 190.0 lb

## 2012-12-02 DIAGNOSIS — Z3480 Encounter for supervision of other normal pregnancy, unspecified trimester: Secondary | ICD-10-CM | POA: Insufficient documentation

## 2012-12-02 DIAGNOSIS — Z3481 Encounter for supervision of other normal pregnancy, first trimester: Secondary | ICD-10-CM

## 2012-12-02 DIAGNOSIS — Z98891 History of uterine scar from previous surgery: Secondary | ICD-10-CM | POA: Insufficient documentation

## 2012-12-02 LAB — COMPREHENSIVE METABOLIC PANEL
ALT: 17 U/L (ref 0–35)
AST: 13 U/L (ref 0–37)
Albumin: 4.1 g/dL (ref 3.5–5.2)
Alkaline Phosphatase: 65 U/L (ref 39–117)
Glucose, Bld: 89 mg/dL (ref 70–99)
Potassium: 3.9 mEq/L (ref 3.5–5.3)
Sodium: 133 mEq/L — ABNORMAL LOW (ref 135–145)
Total Protein: 7.1 g/dL (ref 6.0–8.3)

## 2012-12-02 LAB — HIV ANTIBODY (ROUTINE TESTING W REFLEX): HIV: NONREACTIVE

## 2012-12-02 MED ORDER — PRENATAL VITAMINS 0.8 MG PO TABS: 1.0000 | ORAL_TABLET | Freq: Every day | ORAL | Status: DC

## 2012-12-02 MED ORDER — DOXYLAMINE-PYRIDOXINE 10-10 MG PO TBEC: 10.0000 mg | DELAYED_RELEASE_TABLET | Freq: Every evening | ORAL | Status: DC | PRN

## 2012-12-02 NOTE — Progress Notes (Signed)
Pulse-69 GC/CH on 11-13-12 all negative.  Subjective:    Leslie Mills is being seen today for her first obstetrical visit.  This is not a planned pregnancy. She is at [redacted]w[redacted]d gestation. Her obstetrical history is significant for hypertension. Relationship with FOB: significant other, living together. Patient does intend to breast feed. Pregnancy history fully reviewed.  Menstrual History: OB History   Grav Para Term Preterm Abortions TAB SAB Ect Mult Living   3 1 1  1 1    1       Last Pap: 2014 WNL Menarche age: 36 Regular  Patient's last menstrual period was 10/08/2012.    The following portions of the patient's history were reviewed and updated as appropriate: allergies, current medications, past family history, past medical history, past social history, past surgical history and problem list.  Review of Systems Pertinent items are noted in HPI.    Objective:      General Appearance:    Alert, cooperative, no distress, appears stated age  Head:    Normocephalic, without obvious abnormality, atraumatic  Eyes:    PERRL, conjunctiva/corneas clear, EOM's intact, fundi    benign, both eyes  Ears:    Normal TM's and external ear canals, both ears  Nose:   Nares normal, septum midline, mucosa normal, no drainage    or sinus tenderness  Throat:   Lips, mucosa, and tongue normal; teeth and gums normal  Neck:   Supple, symmetrical, trachea midline, no adenopathy;    thyroid:  no enlargement/tenderness/nodules; no carotid   bruit or JVD  Back:     Symmetric, no curvature, ROM normal, no CVA tenderness  Lungs:     Clear to auscultation bilaterally, respirations unlabored  Chest Wall:    No tenderness or deformity   Heart:    Regular rate and rhythm, S1 and S2 normal, no murmur, rub   or gallop  Breast Exam:    No tenderness, masses, or nipple abnormality  Abdomen:     Soft, non-tender, bowel sounds active all four quadrants,    no masses, no organomegaly  Genitalia:    Normal female  without lesion, discharge or tenderness  Extremities:   Extremities normal, atraumatic, no cyanosis or edema  Pulses:   2+ and symmetric all extremities  Skin:   Skin color, texture, turgor normal, no rashes or lesions  Lymph nodes:   Cervical, supraclavicular, and axillary nodes normal  Neurologic:   CNII-XII intact, normal strength, sensation and reflexes    Throughout  Cardiac activity on an informal U/S    Assessment:    Pregnancy at [redacted]w[redacted]d weeks    Plan:    Initial labs drawn. Prenatal vitamins.  Counseling provided regarding continued use of seat belts, cessation of alcohol consumption, smoking or use of illicit drugs; infection precautions i.e., influenza/TDAP immunizations, toxoplasmosis,CMV, parvovirus, listeria and varicella; workplace safety, exercise during pregnancy; routine dental care, safe medications, sexual activity, hot tubs, saunas, pools, travel, caffeine use, fish and methlymercury, potential toxins, hair treatments, varicose veins Weight gain recommendations reviewed: underweight/BMI< 18.5--> gain 28 - 40 lbs; normal weight/BMI 18.5 - 24.9--> gain 25 - 35 lbs; overweight/BMI 25 - 29.9--> gain 15 - 25 lbs; obese/BMI >30->gain  11 - 20 lbs Problem list reviewed and updated.   Amniocentesis discussed: not indicated. Follow up in 4 weeks. 50% of 20 min visit spent on counseling and coordination of care.

## 2012-12-02 NOTE — Patient Instructions (Addendum)
CF Gene Mutation Testing This is a test used to detect cystic fibrosis (CF) genetic mutations to establish CF carrier status or to establish the diagnosis of CF in an individual. The CF gene mutation test identifies mutations in the CFTR gene on chromosome 7. Each cell in the human body (except sperm and eggs) has 46 chromosomes (23 inherited from the mother and 23 from the father). Genes on these chromosomes form the body's blueprint for producing proteins that control body functions. Cystic fibrosis is caused by a mutation in a pair of genes located on chromosomes 7. Both copies of this gene must be abnormal to cause CF. If only one copy of the gene pair is mutated, the patient will be a carrier. Carriers are not ill, they do not have any symptoms, but they can pass their abnormal CF gene copy on to their children.  When a newborn infant has meconium ileus (no stools in the first 24 to 48 hours of life) or when a person has symptoms of CF (salty sweat, persistent respiratory infections, wheezing, persistent diarrhea, foul-smelling greasy stools, malnutrition, and vitamin deficiency); if a person has a positive sweat chloride or IRT test or a close relative who has been diagnosed with CF; when a patient is undergoing genetic counseling and wants to find out if they are a CF carrier; or for prenatal diagnosis. PREPARATION FOR TEST A blood sample drawn from an infant's heel; a spot of blood that is put onto filter paper; or a blood sample is drawn from a vein in the arm. NORMAL FINDINGS No genetic mutation. Ranges for normal findings may vary among different laboratories and hospitals. You should always check with your doctor after having lab work or other tests done to discuss the meaning of your test results and whether your values are considered within normal limits. MEANING OF TEST Your caregiver will go over the test results with you and discuss the importance and meaning of your results, as well as  treatment options and the need for additional tests if necessary. OBTAINING THE TEST RESULTS It is your responsibility to obtain your test results. Ask the lab or department performing the test when and how you will get your results. Document Released: 05/11/2004 Document Revised: 07/10/2011 Document Reviewed: 03/25/2008 [employer] Patient Information 2014 [employer].  

## 2012-12-03 LAB — OBSTETRIC PANEL
Antibody Screen: NEGATIVE
Basophils Absolute: 0 10*3/uL (ref 0.0–0.1)
Eosinophils Absolute: 0 10*3/uL (ref 0.0–0.7)
Eosinophils Relative: 0 % (ref 0–5)
Lymphs Abs: 2.2 10*3/uL (ref 0.7–4.0)
MCH: 32.5 pg (ref 26.0–34.0)
MCV: 92.8 fL (ref 78.0–100.0)
Platelets: 340 10*3/uL (ref 150–400)
RDW: 12.7 % (ref 11.5–15.5)

## 2012-12-04 LAB — CULTURE, OB URINE: Colony Count: >=100000

## 2012-12-04 LAB — HEMOGLOBINOPATHY EVALUATION
Hgb A: 97.3 % (ref 96.8–97.8)
Hgb S Quant: 0 %

## 2012-12-06 ENCOUNTER — Encounter: Payer: Self-pay | Admitting: Obstetrics & Gynecology

## 2012-12-06 DIAGNOSIS — R8271 Bacteriuria: Secondary | ICD-10-CM | POA: Insufficient documentation

## 2012-12-06 DIAGNOSIS — O9989 Other specified diseases and conditions complicating pregnancy, childbirth and the puerperium: Secondary | ICD-10-CM

## 2012-12-06 NOTE — Progress Notes (Signed)
Quick Note:  Needs PNV w/vitamin D. Needs amoxicillin 500 BID x 7d ______

## 2012-12-14 ENCOUNTER — Inpatient Hospital Stay (HOSPITAL_COMMUNITY)
Admission: AD | Admit: 2012-12-14 | Discharge: 2012-12-14 | Disposition: A | Discharge status: Home / Self Care | Payer: Medicaid Other | Source: Ambulatory Visit | Attending: Obstetrics & Gynecology | Admitting: Obstetrics & Gynecology

## 2012-12-14 ENCOUNTER — Encounter (HOSPITAL_COMMUNITY): Payer: Self-pay | Admitting: *Deleted

## 2012-12-14 DIAGNOSIS — O34219 Maternal care for unspecified type scar from previous cesarean delivery: Secondary | ICD-10-CM | POA: Insufficient documentation

## 2012-12-14 DIAGNOSIS — N390 Urinary tract infection, site not specified: Secondary | ICD-10-CM

## 2012-12-14 DIAGNOSIS — R82998 Other abnormal findings in urine: Secondary | ICD-10-CM | POA: Insufficient documentation

## 2012-12-14 DIAGNOSIS — O210 Mild hyperemesis gravidarum: Secondary | ICD-10-CM | POA: Insufficient documentation

## 2012-12-14 DIAGNOSIS — Z98891 History of uterine scar from previous surgery: Secondary | ICD-10-CM

## 2012-12-14 DIAGNOSIS — Z3481 Encounter for supervision of other normal pregnancy, first trimester: Secondary | ICD-10-CM

## 2012-12-14 DIAGNOSIS — O219 Vomiting of pregnancy, unspecified: Secondary | ICD-10-CM

## 2012-12-14 LAB — URINALYSIS, ROUTINE W REFLEX MICROSCOPIC
Bilirubin Urine: NEGATIVE
Glucose, UA: NEGATIVE mg/dL
Hgb urine dipstick: NEGATIVE
Specific Gravity, Urine: 1.015 (ref 1.005–1.030)
pH: 7 (ref 5.0–8.0)

## 2012-12-14 MED ORDER — PROMETHAZINE HCL 12.5 MG PO TABS: 12.5000 mg | ORAL_TABLET | Freq: Four times a day (QID) | ORAL | Status: DC | PRN

## 2012-12-14 MED ORDER — PROMETHAZINE HCL 25 MG/ML IJ SOLN
12.5000 mg | Freq: Once | INTRAMUSCULAR | Status: AC
  Administered 2012-12-14: 12.5 mg via INTRAMUSCULAR
  Filled 2012-12-14: qty 1

## 2012-12-14 MED ORDER — ONDANSETRON 8 MG PO TBDP
8.0000 mg | ORAL_TABLET | Freq: Once | ORAL | Status: AC
  Administered 2012-12-14: 8 mg via ORAL
  Filled 2012-12-14: qty 1

## 2012-12-14 NOTE — MAU Note (Addendum)
Pt reports n/v took Diglegis 2 days ago, but ran out of samples and pt is waiting for her Medicaid to start. N/V is persisting

## 2012-12-14 NOTE — MAU Provider Note (Signed)
History     CSN: [id number]  Arrival date and time: 12/14/12 4098   First Provider Initiated Contact with Patient 12/14/12 0115      No chief complaint on file.  HPI  Leslie Mills is a 27 y.o. G3P1011 at [redacted]w[redacted]d who presents today with nausea and vomiting. She states that it started at about 6 weeks, and she is vomiting about 2-3 times per day. She had some samples of diclegis, and she has since ran out. She states that it was not helping. She has not tried anything else because the rx that was called in for her was expensive and her medicaid is not active at this time.   Past Medical History  Diagnosis Date  . Lactose intolerance   . Hypertension     Past Surgical History  Procedure Laterality Date  . Cesarean section      Family History  Problem Relation Age of Onset  . Hypertension Mother   . Diabetes Father   . Diabetes Maternal Uncle   . Hypertension Maternal Grandmother   . Diabetes Paternal Grandmother     History  Substance Use Topics  . Smoking status: Former Smoker    Quit date: 11/18/2012  . Smokeless tobacco: Not on file  . Alcohol Use: No    Allergies:  Allergies  Allergen Reactions  . Lactose Intolerance (Gi) Diarrhea  . Latex Itching and Rash    Prescriptions prior to admission  Medication Sig Dispense Refill  . acetaminophen (TYLENOL) 500 MG tablet Take 1,000 mg by mouth every 6 (six) hours as needed for pain (For headache.).      Marland Kitchen Doxylamine-Pyridoxine (DICLEGIS) 10-10 MG TBEC Take 10 mg by mouth at bedtime as needed. Take 2 tabs at bedtime  100 tablet  2  . metroNIDAZOLE (FLAGYL) 500 MG tablet Take 1 tablet (500 mg total) by mouth 2 (two) times daily.  14 tablet  0  . Prenatal Multivit-Min-Fe-FA (PRENATAL VITAMINS) 0.8 MG tablet Take 1 tablet by mouth daily.  30 tablet  12    ROS Physical Exam   Blood pressure 124/84, pulse 86, temperature 99.2 F (37.3 C), temperature source Oral, resp. rate 18, height 5\' 4"  (1.626 m), weight 85.095  kg (187 lb 9.6 oz), last menstrual period 10/08/2012.  Physical Exam  Nursing note and vitals reviewed. Constitutional: She is oriented to person, place, and time. She appears well-developed and well-nourished. No distress.  Cardiovascular: Normal rate.   Respiratory: Effort normal.  GI: Soft.  Neurological: She is alert and oriented to person, place, and time.  Skin: Skin is warm and dry.    MAU Course  Procedures  Results for orders placed during the hospital encounter of 12/14/12 (from the past 24 hour(s))  URINALYSIS, ROUTINE W REFLEX MICROSCOPIC     Status: Abnormal   Collection Time    12/14/12 12:40 AM      Result Value Range   Color, Urine YELLOW  YELLOW   APPearance CLEAR  CLEAR   Specific Gravity, Urine 1.015  1.005 - 1.030   pH 7.0  5.0 - 8.0   Glucose, UA NEGATIVE  NEGATIVE mg/dL   Hgb urine dipstick NEGATIVE  NEGATIVE   Bilirubin Urine NEGATIVE  NEGATIVE   Ketones, ur 15 (*) NEGATIVE mg/dL   Protein, ur NEGATIVE  NEGATIVE mg/dL   Urobilinogen, UA 1.0  0.0 - 1.0 mg/dL   Nitrite NEGATIVE  NEGATIVE   Leukocytes, UA NEGATIVE  NEGATIVE   0223: Patient is feeling better  and has been tolerating po fluids and foods.   Assessment and Plan   1. Asymptomatic bacteriuria in pregnancy, first trimester   2. Previous cesarean section   3. Supervision of other normal pregnancy, first trimester   4. Nausea and vomiting in pregnancy prior to [redacted] weeks gestation    Rx: phenergan #30 Morning sickness comfort measures 1st trimester danger signs FU with the office as planned Return to MAU as needed   Tawnya Crook 12/14/2012, 1:17 AM

## 2013-01-01 ENCOUNTER — Ambulatory Visit (INDEPENDENT_AMBULATORY_CARE_PROVIDER_SITE_OTHER): Payer: Medicaid Other | Admitting: Obstetrics & Gynecology

## 2013-01-01 VITALS — BP 132/92 | Temp 97.8°F | Wt 188.0 lb

## 2013-01-01 DIAGNOSIS — Z3480 Encounter for supervision of other normal pregnancy, unspecified trimester: Secondary | ICD-10-CM

## 2013-01-01 DIAGNOSIS — R82998 Other abnormal findings in urine: Secondary | ICD-10-CM

## 2013-01-01 DIAGNOSIS — R8290 Unspecified abnormal findings in urine: Secondary | ICD-10-CM

## 2013-01-01 LAB — POCT URINALYSIS DIPSTICK
Glucose, UA: NEGATIVE
Ketones, UA: NEGATIVE
Leukocytes, UA: NEGATIVE
Spec Grav, UA: 1.015

## 2013-01-01 NOTE — Progress Notes (Signed)
[id number] 

## 2013-01-02 LAB — CULTURE, OB URINE
Colony Count: NO GROWTH
Organism ID, Bacteria: NO GROWTH

## 2013-01-03 ENCOUNTER — Encounter: Payer: Self-pay | Admitting: Obstetrics & Gynecology

## 2013-01-03 NOTE — Patient Instructions (Signed)
CF Gene Mutation Testing This is a test used to detect cystic fibrosis (CF) genetic mutations to establish CF carrier status or to establish the diagnosis of CF in an individual. The CF gene mutation test identifies mutations in the CFTR gene on chromosome 7. Each cell in the human body (except sperm and eggs) has 46 chromosomes (23 inherited from the mother and 23 from the father). Genes on these chromosomes form the body's blueprint for producing proteins that control body functions. Cystic fibrosis is caused by a mutation in a pair of genes located on chromosomes 7. Both copies of this gene must be abnormal to cause CF. If only one copy of the gene pair is mutated, the patient will be a carrier. Carriers are not ill, they do not have any symptoms, but they can pass their abnormal CF gene copy on to their children.  When a newborn infant has meconium ileus (no stools in the first 24 to 48 hours of life) or when a person has symptoms of CF (salty sweat, persistent respiratory infections, wheezing, persistent diarrhea, foul-smelling greasy stools, malnutrition, and vitamin deficiency); if a person has a positive sweat chloride or IRT test or a close relative who has been diagnosed with CF; when a patient is undergoing genetic counseling and wants to find out if they are a CF carrier; or for prenatal diagnosis. PREPARATION FOR TEST A blood sample drawn from an infant's heel; a spot of blood that is put onto filter paper; or a blood sample is drawn from a vein in the arm. NORMAL FINDINGS No genetic mutation. Ranges for normal findings may vary among different laboratories and hospitals. You should always check with your doctor after having lab work or other tests done to discuss the meaning of your test results and whether your values are considered within normal limits. MEANING OF TEST Your caregiver will go over the test results with you and discuss the importance and meaning of your results, as well as  treatment options and the need for additional tests if necessary. OBTAINING THE TEST RESULTS It is your responsibility to obtain your test results. Ask the lab or department performing the test when and how you will get your results. Document Released: 05/11/2004 Document Revised: 07/10/2011 Document Reviewed: 03/25/2008 [employer] Patient Information 2014 [employer].  

## 2013-01-03 NOTE — Progress Notes (Signed)
FIRST/cystic fibrosis carrier testing discussed.

## 2013-01-10 ENCOUNTER — Other Ambulatory Visit: Payer: Self-pay | Admitting: *Deleted

## 2013-01-10 DIAGNOSIS — N390 Urinary tract infection, site not specified: Secondary | ICD-10-CM

## 2013-01-10 MED ORDER — AMOXICILLIN 500 MG PO CAPS: 500.0000 mg | ORAL_CAPSULE | Freq: Two times a day (BID) | ORAL | Status: DC

## 2013-01-13 ENCOUNTER — Encounter: Payer: Self-pay | Admitting: Obstetrics & Gynecology

## 2013-01-23 ENCOUNTER — Encounter (HOSPITAL_COMMUNITY): Payer: Self-pay | Admitting: *Deleted

## 2013-01-23 ENCOUNTER — Inpatient Hospital Stay (HOSPITAL_COMMUNITY)
Admission: AD | Admit: 2013-01-23 | Discharge: 2013-01-23 | Disposition: A | Discharge status: Home / Self Care | Payer: Medicaid Other | Source: Ambulatory Visit | Attending: Obstetrics & Gynecology | Admitting: Obstetrics & Gynecology

## 2013-01-23 DIAGNOSIS — R109 Unspecified abdominal pain: Secondary | ICD-10-CM | POA: Insufficient documentation

## 2013-01-23 DIAGNOSIS — K219 Gastro-esophageal reflux disease without esophagitis: Secondary | ICD-10-CM

## 2013-01-23 DIAGNOSIS — R079 Chest pain, unspecified: Secondary | ICD-10-CM | POA: Insufficient documentation

## 2013-01-23 DIAGNOSIS — K5900 Constipation, unspecified: Secondary | ICD-10-CM

## 2013-01-23 DIAGNOSIS — O99891 Other specified diseases and conditions complicating pregnancy: Secondary | ICD-10-CM | POA: Insufficient documentation

## 2013-01-23 DIAGNOSIS — F410 Panic disorder [episodic paroxysmal anxiety] without agoraphobia: Secondary | ICD-10-CM

## 2013-01-23 LAB — CBC
Hemoglobin: 11.7 g/dL — ABNORMAL LOW (ref 12.0–15.0)
MCH: 32.6 pg (ref 26.0–34.0)
MCHC: 35.9 g/dL (ref 30.0–36.0)
Platelets: 282 10*3/uL (ref 150–400)
RBC: 3.59 MIL/uL — ABNORMAL LOW (ref 3.87–5.11)

## 2013-01-23 LAB — COMPREHENSIVE METABOLIC PANEL
ALT: 8 U/L (ref 0–35)
AST: 9 U/L (ref 0–37)
Alkaline Phosphatase: 53 U/L (ref 39–117)
CO2: 21 mEq/L (ref 19–32)
Calcium: 9.4 mg/dL (ref 8.4–10.5)
GFR calc Af Amer: >90 mL/min (ref >90)
Glucose, Bld: 101 mg/dL — ABNORMAL HIGH (ref 70–99)
Potassium: 3.8 mEq/L (ref 3.5–5.1)
Sodium: 135 mEq/L (ref 135–145)
Total Protein: 6.6 g/dL (ref 6.0–8.3)

## 2013-01-23 MED ORDER — FAMOTIDINE 20 MG PO TABS: 20.0000 mg | ORAL_TABLET | Freq: Two times a day (BID) | ORAL | Status: DC | PRN

## 2013-01-23 MED ORDER — GI COCKTAIL ~~LOC~~
30.0000 mL | Freq: Once | ORAL | Status: AC
  Administered 2013-01-23: 30 mL via ORAL
  Filled 2013-01-23: qty 30

## 2013-01-23 MED ORDER — DOCUSATE SODIUM 100 MG PO CAPS: 100.0000 mg | ORAL_CAPSULE | Freq: Two times a day (BID) | ORAL | Status: DC | PRN

## 2013-01-23 NOTE — MAU Note (Signed)
Woke up at 0330 am feeling hot. Felt like she had a panic attack and chest pain. Since then has upper abdominal cramping. Denies vaginal bleeding or vaginal discharge.

## 2013-01-23 NOTE — MAU Provider Note (Signed)
Chief Complaint: Chest Pain, Panic Attack and Abdominal Pain  First Provider Initiated Contact with Patient 01/23/13 0603     SUBJECTIVE HPI: Leslie Mills is a 27 y.o. G3P1011 at [redacted]w[redacted]d by LMP who presents with possible panic attack. Woke up tonight with shooting pains across her upper chest, feeling hot and having upper abd tightness. Both resolved before arrival to MAU. Pt states she had been under stress lately. Thinks Sx may be related. Denies chest pressure, chest tightness, SOB, HA, low abd pain, VB, vaginal discharge.   Frequent heartburn w/ pregnancy. Tums work, but don't last long. C/O constipation throughout pregnancy. BMs had been Q 5 days. Now Q2-3 days after increasing fruit consumption. Still difficult to pass and has upper abd cramping and tightness before and after BMs. Tried MiraLax once. Too strong.   Hx mild elevated BP w/ previous pregnancy that continued PP. Normal so far this pregnancy per pt.    Past Medical History  Diagnosis Date  . Lactose intolerance   . Hypertension    OB History  Gravida Para Term Preterm AB SAB TAB Ectopic Multiple Living  3 1 1  1  1   1     # Outcome Date GA Lbr Len/2nd Weight Sex Delivery Anes PTL Lv  3 CUR           2 TRM 04/13/08 [redacted]w[redacted]d  2.665 kg (5 lb 14 oz) M LVCS Spinal  Y     Comments: Placenta previa  1 TAB              Past Surgical History  Procedure Laterality Date  . Cesarean section     History   Social History  . Marital Status: Single    Spouse Name: N/A    Number of Children: N/A  . Years of Education: N/A   Occupational History  . Not on file.   Social History Main Topics  . Smoking status: Former Smoker    Quit date: 11/18/2012  . Smokeless tobacco: Not on file  . Alcohol Use: No  . Drug Use: No  . Sexual Activity: No   Other Topics Concern  . Not on file   Social History Narrative  . No narrative on file   No current facility-administered medications on file prior to encounter.   Current  Outpatient Prescriptions on File Prior to Encounter  Medication Sig Dispense Refill  . acetaminophen (TYLENOL) 500 MG tablet Take 1,000 mg by mouth every 6 (six) hours as needed for pain (For headache.).      Marland Kitchen Prenatal Multivit-Min-Fe-FA (PRENATAL VITAMINS) 0.8 MG tablet Take 1 tablet by mouth daily.  30 tablet  12  . amoxicillin (AMOXIL) 500 MG capsule Take 1 capsule (500 mg total) by mouth 2 (two) times daily.  14 capsule  0   Allergies  Allergen Reactions  . Lactose Intolerance (Gi) Diarrhea  . Latex Itching and Rash    ROS: Pertinent items in HPI  OBJECTIVE Blood pressure 139/83, pulse 84, temperature 99.1 F (37.3 C), temperature source Oral, resp. rate 18, last menstrual period 10/08/2012, SpO2 100.00%. GENERAL: Well-developed, well-nourished female in no acute distress.  HEENT: Normocephalic HEART: normal rate and rhythm. No M/R/G.  CHEST: NT.  RESP: normal effort. CTAB.  ABDOMEN: Soft, non-tender. No CVAT, EXTREMITIES: Nontender, no edema NEURO: Alert and oriented SPECULUM EXAM:Deferred  LAB RESULTS Results for orders placed during the hospital encounter of 01/23/13 (from the past 168 hour(s))  CBC   Collection Time  01/23/13  6:22 AM      Result Value Range   WBC 13.8 (*) 4.0 - 10.5 K/uL   RBC 3.59 (*) 3.87 - 5.11 MIL/uL   Hemoglobin 11.7 (*) 12.0 - 15.0 g/dL   HCT 27.2 (*) 53.6 - 64.4 %   MCV 90.8  78.0 - 100.0 fL   MCH 32.6  26.0 - 34.0 pg   MCHC 35.9  30.0 - 36.0 g/dL   RDW 03.4  74.2 - 59.5 %   Platelets 282  150 - 400 K/uL  COMPREHENSIVE METABOLIC PANEL   Collection Time    01/23/13  6:22 AM      Result Value Range   Sodium 135  135 - 145 mEq/L   Potassium 3.8  3.5 - 5.1 mEq/L   Chloride 104  96 - 112 mEq/L   CO2 21  19 - 32 mEq/L   Glucose, Bld 101 (*) 70 - 99 mg/dL   BUN 6  6 - 23 mg/dL   Creatinine, Ser 6.38  0.50 - 1.10 mg/dL   Calcium 9.4  8.4 - 75.6 mg/dL   Total Protein 6.6  6.0 - 8.3 g/dL   Albumin 3.1 (*) 3.5 - 5.2 g/dL   AST 9  0 - 37  U/L   ALT 8  0 - 35 U/L   Alkaline Phosphatase 53  39 - 117 U/L   Total Bilirubin 0.2 (*) 0.3 - 1.2 mg/dL   GFR calc non Af Amer >90  >90 mL/min   GFR calc Af Amer >90  >90 mL/min   IMAGING No results found.  MAU COURSE  ASSESSMENT 1. Panic attack   2. Gastroesophageal reflux in pregancy in second trimester   3. Constipation in pregnancy in second trimester    PLAN Discharge home in stable condition. Discussed management of panic attacks. If sensation recurs in absence of precipitating event suggest monitoring HR for tachycardia and progressive relaxation. Increase fluid and fiber.       Follow-up Information   Follow up with [facility] On 01/29/2013.   Contact information:   [address] [place] [address] [phone]      Follow up with [facility]ERN[facility] ADMISSIONS. (As needed in emergencies)    Contact information:   [address] [id number] [place] [address] [phone]       Medication List    STOP taking these medications       Doxylamine-Pyridoxine 10-10 MG Tbec  Commonly known as:  DICLEGIS     metroNIDAZOLE 500 MG tablet  Commonly known as:  FLAGYL     promethazine 12.5 MG tablet  Commonly known as:  PHENERGAN      TAKE these medications       acetaminophen 500 MG tablet  Commonly known as:  TYLENOL  Take 1,000 mg by mouth every 6 (six) hours as needed for pain (For headache.).     amoxicillin 500 MG capsule  Commonly known as:  AMOXIL  Take 1 capsule (500 mg total) by mouth 2 (two) times daily.     docusate sodium 100 MG capsule  Commonly known as:  COLACE  Take 1 capsule (100 mg total) by mouth 2 (two) times daily as needed for constipation.     famotidine 20 MG tablet  Commonly known as:  PEPCID  Take 1 tablet (20 mg total) by mouth 2 (two) times daily as needed for heartburn.     Prenatal Vitamins  0.8 MG tablet  Take 1 tablet by mouth daily.        [place], [place] 01/23/2013  6:37 AM

## 2013-01-29 ENCOUNTER — Ambulatory Visit (INDEPENDENT_AMBULATORY_CARE_PROVIDER_SITE_OTHER): Payer: Medicaid Other | Admitting: Obstetrics & Gynecology

## 2013-01-29 VITALS — BP 138/84 | Temp 98.4°F | Wt 195.8 lb

## 2013-01-29 DIAGNOSIS — Z3480 Encounter for supervision of other normal pregnancy, unspecified trimester: Secondary | ICD-10-CM

## 2013-01-29 DIAGNOSIS — N390 Urinary tract infection, site not specified: Secondary | ICD-10-CM

## 2013-01-29 DIAGNOSIS — Z3482 Encounter for supervision of other normal pregnancy, second trimester: Secondary | ICD-10-CM

## 2013-01-29 LAB — POCT URINALYSIS DIPSTICK
Bilirubin, UA: NEGATIVE
Glucose, UA: NEGATIVE
Spec Grav, UA: 1.025
pH, UA: 5

## 2013-01-29 NOTE — Progress Notes (Signed)
Pulse: 97

## 2013-02-05 ENCOUNTER — Other Ambulatory Visit: Payer: Self-pay | Admitting: Obstetrics & Gynecology

## 2013-02-12 ENCOUNTER — Ambulatory Visit (INDEPENDENT_AMBULATORY_CARE_PROVIDER_SITE_OTHER): Payer: Medicaid Other

## 2013-02-12 DIAGNOSIS — Z3480 Encounter for supervision of other normal pregnancy, unspecified trimester: Secondary | ICD-10-CM

## 2013-02-12 DIAGNOSIS — O99212 Obesity complicating pregnancy, second trimester: Secondary | ICD-10-CM

## 2013-02-12 DIAGNOSIS — Z3689 Encounter for other specified antenatal screening: Secondary | ICD-10-CM

## 2013-02-13 ENCOUNTER — Encounter: Payer: Self-pay | Admitting: Obstetrics & Gynecology

## 2013-02-13 LAB — US OB DETAIL + 14 WK

## 2013-02-14 ENCOUNTER — Encounter: Payer: Self-pay | Admitting: Obstetrics & Gynecology

## 2013-02-14 DIAGNOSIS — O283 Abnormal ultrasonic finding on antenatal screening of mother: Secondary | ICD-10-CM | POA: Insufficient documentation

## 2013-02-15 NOTE — Patient Instructions (Signed)
Pregnancy - Second Trimester The second trimester of pregnancy (3 to 6 months) is a period of rapid growth for you and your baby. At the end of the sixth month, your baby is about 9 inches long and weighs 1 1/2 pounds. You will begin to feel the baby move between 18 and 20 weeks of the pregnancy. This is called quickening. Weight gain is faster. A clear fluid (colostrum) may leak out of your breasts. You may feel small contractions of the womb (uterus). This is known as false labor or Braxton-Hicks contractions. This is like a practice for labor when the baby is ready to be born. Usually, the problems with morning sickness have usually passed by the end of your first trimester. Some women develop small dark blotches (called cholasma, mask of pregnancy) on their face that usually goes away after the baby is born. Exposure to the sun makes the blotches worse. Acne may also develop in some pregnant women and pregnant women who have acne, may find that it goes away. PRENATAL EXAMS  Blood work may continue to be done during prenatal exams. These tests are done to check on your health and the probable health of your baby. Blood work is used to follow your blood levels (hemoglobin). Anemia (low hemoglobin) is common during pregnancy. Iron and vitamins are given to help prevent this. You will also be checked for diabetes between 24 and 28 weeks of the pregnancy. Some of the previous blood tests may be repeated.  The size of the uterus is measured during each visit. This is to make sure that the baby is continuing to grow properly according to the dates of the pregnancy.  Your blood pressure is checked every prenatal visit. This is to make sure you are not getting toxemia.  Your urine is checked to make sure you do not have an infection, diabetes or protein in the urine.  Your weight is checked often to make sure gains are happening at the suggested rate. This is to ensure that both you and your baby are  growing normally.  Sometimes, an ultrasound is performed to confirm the proper growth and development of the baby. This is a test which bounces harmless sound waves off the baby so your caregiver can more accurately determine due dates. Sometimes, a test is done on the amniotic fluid surrounding the baby. This test is called an amniocentesis. The amniotic fluid is obtained by sticking a needle into the belly (abdomen). This is done to check the chromosomes in instances where there is a concern about possible genetic problems with the baby. It is also sometimes done near the end of pregnancy if an early delivery is required. In this case, it is done to help make sure the baby's lungs are mature enough for the baby to live outside of the womb. CHANGES OCCURING IN THE SECOND TRIMESTER OF PREGNANCY Your body goes through many changes during pregnancy. They vary from person to person. Talk to your caregiver about changes you notice that you are concerned about.  During the second trimester, you will likely have an increase in your appetite. It is normal to have cravings for certain foods. This varies from person to person and pregnancy to pregnancy.  Your lower abdomen will begin to bulge.  You may have to urinate more often because the uterus and baby are pressing on your bladder. It is also common to get more bladder infections during pregnancy. You can help this by drinking lots of fluids   and emptying your bladder before and after intercourse.  You may begin to get stretch marks on your hips, abdomen, and breasts. These are normal changes in the body during pregnancy. There are no exercises or medicines to take that prevent this change.  You may begin to develop swollen and bulging veins (varicose veins) in your legs. Wearing support hose, elevating your feet for 15 minutes, 3 to 4 times a day and limiting salt in your diet helps lessen the problem.  Heartburn may develop as the uterus grows and  pushes up against the stomach. Antacids recommended by your caregiver helps with this problem. Also, eating smaller meals 4 to 5 times a day helps.  Constipation can be treated with a stool softener or adding bulk to your diet. Drinking lots of fluids, and eating vegetables, fruits, and whole grains are helpful.  Exercising is also helpful. If you have been very active up until your pregnancy, most of these activities can be continued during your pregnancy. If you have been less active, it is helpful to start an exercise program such as walking.  Hemorrhoids may develop at the end of the second trimester. Warm sitz baths and hemorrhoid cream recommended by your caregiver helps hemorrhoid problems.  Backaches may develop during this time of your pregnancy. Avoid heavy lifting, wear low heal shoes, and practice good posture to help with backache problems.  Some pregnant women develop tingling and numbness of their hand and fingers because of swelling and tightening of ligaments in the wrist (carpel tunnel syndrome). This goes away after the baby is born.  As your breasts enlarge, you may have to get a bigger bra. Get a comfortable, cotton, support bra. Do not get a nursing bra until the last month of the pregnancy if you will be nursing the baby.  You may get a dark line from your belly button to the pubic area called the linea nigra.  You may develop rosy cheeks because of increase blood flow to the face.  You may develop spider looking lines of the face, neck, arms, and chest. These go away after the baby is born. HOME CARE INSTRUCTIONS   It is extremely important to avoid all smoking, herbs, alcohol, and unprescribed drugs during your pregnancy. These chemicals affect the formation and growth of the baby. Avoid these chemicals throughout the pregnancy to ensure the delivery of a healthy infant.  Most of your home care instructions are the same as suggested for the first trimester of your  pregnancy. Keep your caregiver's appointments. Follow your caregiver's instructions regarding medicine use, exercise, and diet.  During pregnancy, you are providing food for you and your baby. Continue to eat regular, well-balanced meals. Choose foods such as meat, fish, milk and other low fat dairy products, vegetables, fruits, and whole-grain breads and cereals. Your caregiver will tell you of the ideal weight gain.  A physical sexual relationship may be continued up until near the end of pregnancy if there are no other problems. Problems could include early (premature) leaking of amniotic fluid from the membranes, vaginal bleeding, abdominal pain, or other medical or pregnancy problems.  Exercise regularly if there are no restrictions. Check with your caregiver if you are unsure of the safety of some of your exercises. The greatest weight gain will occur in the last 2 trimesters of pregnancy. Exercise will help you:  Control your weight.  Get you in shape for labor and delivery.  Lose weight after you have the baby.  Wear   a good support or jogging bra for breast tenderness during pregnancy. This may help if worn during sleep. Pads or tissues may be used in the bra if you are leaking colostrum.  Do not use hot tubs, steam rooms or saunas throughout the pregnancy.  Wear your seat belt at all times when driving. This protects you and your baby if you are in an accident.  Avoid raw meat, uncooked cheese, cat litter boxes, and soil used by cats. These carry germs that can cause birth defects in the baby.  The second trimester is also a good time to visit your dentist for your dental health if this has not been done yet. Getting your teeth cleaned is okay. Use a soft toothbrush. Brush gently during pregnancy.  It is easier to leak urine during pregnancy. Tightening up and strengthening the pelvic muscles will help with this problem. Practice stopping your urination while you are going to the  bathroom. These are the same muscles you need to strengthen. It is also the muscles you would use as if you were trying to stop from passing gas. You can practice tightening these muscles up 10 times a set and repeating this about 3 times per day. Once you know what muscles to tighten up, do not perform these exercises during urination. It is more likely to contribute to an infection by backing up the urine.  Ask for help if you have financial, counseling, or nutritional needs during pregnancy. Your caregiver will be able to offer counseling for these needs as well as refer you for other special needs.  Your skin may become oily. If so, wash your face with mild soap, use non-greasy moisturizer and oil or cream based makeup. MEDICINES AND DRUG USE IN PREGNANCY  Take prenatal vitamins as directed. The vitamin should contain 1 milligram of folic acid. Keep all vitamins out of reach of children. Only a couple vitamins or tablets containing iron may be fatal to a baby or young child when ingested.  Avoid use of all medicines, including herbs, over-the-counter medicines, not prescribed or suggested by your caregiver. Only take over-the-counter or prescription medicines for pain, discomfort, or fever as directed by your caregiver. Do not use aspirin.  Let your caregiver also know about herbs you may be using.  Alcohol is related to a number of birth defects. This includes fetal alcohol syndrome. All alcohol, in any form, should be avoided completely. Smoking will cause low birth rate and premature babies.  Street or illegal drugs are very harmful to the baby. They are absolutely forbidden. A baby born to an addicted mother will be addicted at birth. The baby will go through the same withdrawal an adult does. SEEK MEDICAL CARE IF:  You have any concerns or worries during your pregnancy. It is better to call with your questions if you feel they cannot wait, rather than worry about them. SEEK IMMEDIATE  MEDICAL CARE IF:   An unexplained oral temperature above 102 F (38.9 C) develops, or as your caregiver suggests.  You have leaking of fluid from the vagina (birth canal). If leaking membranes are suspected, take your temperature and tell your caregiver of this when you call.  There is vaginal spotting, bleeding, or passing clots. Tell your caregiver of the amount and how many pads are used. Light spotting in pregnancy is common, especially following intercourse.  You develop a bad smelling vaginal discharge with a change in the color from clear to white.  You continue to feel   sick to your stomach (nauseated) and have no relief from remedies suggested. You vomit blood or coffee ground-like materials.  You lose more than 2 pounds of weight or gain more than 2 pounds of weight over 1 week, or as suggested by your caregiver.  You notice swelling of your face, hands, feet, or legs.  You get exposed to German measles and have never had them.  You are exposed to fifth disease or chickenpox.  You develop belly (abdominal) pain. Round ligament discomfort is a common non-cancerous (benign) cause of abdominal pain in pregnancy. Your caregiver still must evaluate you.  You develop a bad headache that does not go away.  You develop fever, diarrhea, pain with urination, or shortness of breath.  You develop visual problems, blurry, or double vision.  You fall or are in a car accident or any kind of trauma.  There is mental or physical violence at home. Document Released: 04/11/2001 Document Revised: 01/10/2012 Document Reviewed: 10/14/2008 [employer] Patient Information 2014 [employer].  

## 2013-02-26 ENCOUNTER — Encounter: Payer: Self-pay | Admitting: Obstetrics & Gynecology

## 2013-02-26 ENCOUNTER — Encounter: Payer: Medicaid Other | Admitting: Obstetrics & Gynecology

## 2013-02-26 ENCOUNTER — Ambulatory Visit (INDEPENDENT_AMBULATORY_CARE_PROVIDER_SITE_OTHER): Payer: Medicaid Other | Admitting: Obstetrics & Gynecology

## 2013-02-26 VITALS — BP 130/87 | Wt 202.0 lb

## 2013-02-26 DIAGNOSIS — Z3480 Encounter for supervision of other normal pregnancy, unspecified trimester: Secondary | ICD-10-CM

## 2013-02-26 DIAGNOSIS — Z3482 Encounter for supervision of other normal pregnancy, second trimester: Secondary | ICD-10-CM

## 2013-02-26 LAB — POCT URINALYSIS DIPSTICK
Glucose, UA: NEGATIVE
Ketones, UA: NEGATIVE
Leukocytes, UA: NEGATIVE
Nitrite, UA: NEGATIVE
Protein, UA: NEGATIVE
Spec Grav, UA: 1.02

## 2013-02-26 NOTE — Progress Notes (Signed)
HR - 97 Routine OB visit, pt would like to change to different vitamin, states she has nausea after taking

## 2013-02-26 NOTE — Patient Instructions (Signed)
Glucose Tolerance Test This is a test to see how your body processes carbohydrates. This test is often done to check patients for diabetes or the possibility of developing it. PREPARATION FOR TEST You should have nothing to eat or drink 12 hours before the test. You will be given a form of sugar (glucose) and then blood samples will be drawn from your vein to determine the level of sugar in your blood. Alternatively, blood may be drawn from your finger for testing. You should not smoke or exercise during the test. NORMAL FINDINGS  Fasting: 70-115 mg/dL  30 minutes: less than 200 mg/dL  1 hour: less than 200 mg/dL  2 hours: less than 140 mg/dL  3 hours: 70-115 mg/dL  4 hours: 70-115 mg/dL Ranges for normal findings may vary among different laboratories and hospitals. You should always check with your doctor after having lab work or other tests done to discuss the meaning of your test results and whether your values are considered within normal limits. MEANING OF TEST Your caregiver will go over the test results with you and discuss the importance and meaning of your results, as well as treatment options and the need for additional tests. OBTAINING THE TEST RESULTS It is your responsibility to obtain your test results. Ask the lab or department performing the test when and how you will get your results. Document Released: 05/10/2004 Document Revised: 07/10/2011 Document Reviewed: 03/28/2008 [employer] Patient Information 2014 [employer].  

## 2013-02-27 ENCOUNTER — Encounter (HOSPITAL_COMMUNITY): Payer: Self-pay | Admitting: *Deleted

## 2013-02-27 ENCOUNTER — Inpatient Hospital Stay (HOSPITAL_COMMUNITY)
Admission: AD | Admit: 2013-02-27 | Discharge: 2013-02-27 | Disposition: A | Discharge status: Home / Self Care | Payer: Medicaid Other | Source: Ambulatory Visit | Attending: Obstetrics | Admitting: Obstetrics

## 2013-02-27 DIAGNOSIS — O283 Abnormal ultrasonic finding on antenatal screening of mother: Secondary | ICD-10-CM

## 2013-02-27 DIAGNOSIS — O99891 Other specified diseases and conditions complicating pregnancy: Secondary | ICD-10-CM

## 2013-02-27 DIAGNOSIS — N390 Urinary tract infection, site not specified: Secondary | ICD-10-CM

## 2013-02-27 DIAGNOSIS — Z98891 History of uterine scar from previous surgery: Secondary | ICD-10-CM

## 2013-02-27 DIAGNOSIS — Z3481 Encounter for supervision of other normal pregnancy, first trimester: Secondary | ICD-10-CM

## 2013-02-27 DIAGNOSIS — W010XXA Fall on same level from slipping, tripping and stumbling without subsequent striking against object, initial encounter: Secondary | ICD-10-CM

## 2013-02-27 DIAGNOSIS — R1084 Generalized abdominal pain: Secondary | ICD-10-CM

## 2013-02-27 DIAGNOSIS — Y92009 Unspecified place in unspecified non-institutional (private) residence as the place of occurrence of the external cause: Secondary | ICD-10-CM

## 2013-02-27 DIAGNOSIS — W19XXXA Unspecified fall, initial encounter: Secondary | ICD-10-CM

## 2013-02-27 LAB — URINALYSIS, ROUTINE W REFLEX MICROSCOPIC
Glucose, UA: NEGATIVE mg/dL
Ketones, ur: NEGATIVE mg/dL
Nitrite: NEGATIVE
Protein, ur: NEGATIVE mg/dL
Urobilinogen, UA: 0.2 mg/dL (ref 0.0–1.0)
pH: 6 (ref 5.0–8.0)

## 2013-02-27 LAB — URINE MICROSCOPIC-ADD ON

## 2013-02-27 NOTE — MAU Note (Signed)
Pt reports she slipped and fell on her abd at about 4 pm. Denies bleeding.

## 2013-02-27 NOTE — MAU Note (Signed)
Pt states had a slip aand fall about 1600. Pt states she hit her abdomen.

## 2013-02-27 NOTE — Progress Notes (Signed)
Pt  States the pain increased when she moves around "If i were to get up"

## 2013-02-27 NOTE — MAU Provider Note (Signed)
History     CSN: [id number]  Arrival date and time: 02/27/13 1844   First Provider Initiated Contact with Patient 02/27/13 2014      Chief Complaint  Patient presents with  . Fall   HPI  Leslie Mills is 27 y.o. G3P1011 [redacted]w[redacted]d weeks presenting after falling and hitting her abdomen on the table.  Patient of Dr. Leary Roca.  She states she called the office but the call would not go through.  States she is having pain at rest and more pain when "I get up".  Denies vaginal bleeding.  Reports that she has felt the baby move.  Hx of low fluid and placenta previa with previous pregnancy.      Past Medical History  Diagnosis Date  . Lactose intolerance   . Hypertension     Past Surgical History  Procedure Laterality Date  . Cesarean section      Family History  Problem Relation Age of Onset  . Hypertension Mother   . Diabetes Father   . Diabetes Maternal Uncle   . Hypertension Maternal Grandmother   . Diabetes Paternal Grandmother     History  Substance Use Topics  . Smoking status: Former Smoker    Quit date: 11/18/2012  . Smokeless tobacco: Not on file  . Alcohol Use: No    Allergies:  Allergies  Allergen Reactions  . Lactose Intolerance (Gi) Diarrhea  . Latex Itching and Rash    Prescriptions prior to admission  Medication Sig Dispense Refill  . acetaminophen (TYLENOL) 500 MG tablet Take 500 mg by mouth every 6 (six) hours as needed for pain (stomach cframps).      . Prenatal Multivit-Min-Fe-FA (PRENATAL VITAMINS) 0.8 MG tablet Take 1 tablet by mouth daily.  30 tablet  12    Review of Systems  Constitutional: Negative for fever and chills.  Gastrointestinal: Positive for abdominal pain. Negative for nausea and vomiting.  Genitourinary:       Neg for vaginal bleeding  Neurological: Negative for headaches.   Physical Exam   Blood pressure 131/84, pulse 97, temperature 98.4 F (36.9 C), temperature source Oral, resp. rate 18, height 5\' 4"  (1.626 m),  weight 203 lb (92.08 kg), last menstrual period 10/08/2012, SpO2 100.00%.  Physical Exam  Constitutional: She is oriented to person, place, and time. She appears well-developed and well-nourished. No distress.  HENT:  Head: Normocephalic.  Neck: Normal range of motion.  Cardiovascular: Normal rate.   Respiratory: Effort normal.  GI: Soft. She exhibits no distension and no mass. There is no tenderness. There is no rebound and no guarding.  Genitourinary: There is no rash, tenderness or lesion on the right labia. There is no rash, tenderness or lesion on the left labia. Uterus is enlarged (fundus at umbilicus). Uterus is not tender. No erythema, tenderness or bleeding around the vagina. No vaginal discharge found.  Cervix is closed  Neurological: She is alert and oriented to person, place, and time.  Skin: Skin is warm and dry.  Psychiatric: She has a normal mood and affect. Her behavior is normal.   FETAL HEART RATE by DOPPLER 140  Results for orders placed during the hospital encounter of 02/27/13 (from the past 24 hour(s))  URINALYSIS, ROUTINE W REFLEX MICROSCOPIC     Status: Abnormal   Collection Time    02/27/13  7:44 PM      Result Value Range   Color, Urine YELLOW  YELLOW   APPearance CLEAR  CLEAR   Specific Gravity,  Urine >1.030 (*) 1.005 - 1.030   pH 6.0  5.0 - 8.0   Glucose, UA NEGATIVE  NEGATIVE mg/dL   Hgb urine dipstick TRACE (*) NEGATIVE   Bilirubin Urine NEGATIVE  NEGATIVE   Ketones, ur NEGATIVE  NEGATIVE mg/dL   Protein, ur NEGATIVE  NEGATIVE mg/dL   Urobilinogen, UA 0.2  0.0 - 1.0 mg/dL   Nitrite NEGATIVE  NEGATIVE   Leukocytes, UA NEGATIVE  NEGATIVE  URINE MICROSCOPIC-ADD ON     Status: Abnormal   Collection Time    02/27/13  7:44 PM      Result Value Range   Squamous Epithelial / LPF FEW (*) RARE   WBC, UA 0-2  <3 WBC/hpf   RBC / HPF 0-2  <3 RBC/hpf   Bacteria, UA RARE  RARE   MAU Course  Procedures  MDM Reported MSE, physical exam to Dr. Gaynell Face, who  is on call for Dr.Harper.  Order given to monitor her for 1 hour, ultrasound is not needed, discuss signs of an abruption. 21:00 Care turned over to J. Mckynzie Liwanag, NP.  Assessment and Plan  A:  Fall in second trimester pregnancy  P:  Discussed signs of abruption with the patient      May take tylenol for discomfort     Follow up with Dr. Alvia Grove M 02/27/2013, 8:15 PM   Reassessed patient prior to discharge home, no complaints of pain or bleeding. Reinforced signs of abruption with patient and she verbalized understanding.  Pt voiced concern about not receiving an [place] today and in the office. She is upset that she has not found out the sex of her baby and she will not have another [place] until 30 weeks.   Iona Hansen Kinta Martis, NP 02/27/2013 10:03 PM

## 2013-02-27 NOTE — Progress Notes (Signed)
No blood noted on speculum exam. Cervix closed

## 2013-02-28 ENCOUNTER — Encounter: Payer: Self-pay | Admitting: Obstetrics & Gynecology

## 2013-02-28 ENCOUNTER — Other Ambulatory Visit: Payer: Self-pay | Admitting: Advanced Practice Midwife

## 2013-02-28 ENCOUNTER — Encounter: Payer: Self-pay | Admitting: Advanced Practice Midwife

## 2013-02-28 ENCOUNTER — Encounter (HOSPITAL_COMMUNITY): Payer: Self-pay

## 2013-02-28 ENCOUNTER — Ambulatory Visit (HOSPITAL_COMMUNITY)
Admission: RE | Admit: 2013-02-28 | Discharge: 2013-02-28 | Disposition: A | Discharge status: Home / Self Care | Payer: Medicaid Other | Source: Ambulatory Visit | Attending: Advanced Practice Midwife | Admitting: Advanced Practice Midwife

## 2013-02-28 VITALS — BP 142/81 | HR 102 | Wt 204.5 lb

## 2013-02-28 DIAGNOSIS — Z3689 Encounter for other specified antenatal screening: Secondary | ICD-10-CM

## 2013-02-28 DIAGNOSIS — Z1389 Encounter for screening for other disorder: Secondary | ICD-10-CM | POA: Insufficient documentation

## 2013-02-28 DIAGNOSIS — Z363 Encounter for antenatal screening for malformations: Secondary | ICD-10-CM | POA: Insufficient documentation

## 2013-02-28 DIAGNOSIS — O283 Abnormal ultrasonic finding on antenatal screening of mother: Secondary | ICD-10-CM

## 2013-02-28 DIAGNOSIS — O34219 Maternal care for unspecified type scar from previous cesarean delivery: Secondary | ICD-10-CM | POA: Insufficient documentation

## 2013-02-28 DIAGNOSIS — O358XX0 Maternal care for other (suspected) fetal abnormality and damage, not applicable or unspecified: Secondary | ICD-10-CM | POA: Insufficient documentation

## 2013-02-28 DIAGNOSIS — Z98891 History of uterine scar from previous surgery: Secondary | ICD-10-CM

## 2013-02-28 DIAGNOSIS — R9389 Abnormal findings on diagnostic imaging of other specified body structures: Secondary | ICD-10-CM

## 2013-02-28 LAB — US OB DETAIL + 14 WK

## 2013-03-06 ENCOUNTER — Other Ambulatory Visit: Payer: Self-pay

## 2013-03-12 ENCOUNTER — Encounter: Payer: Medicaid Other | Admitting: Obstetrics & Gynecology

## 2013-03-13 ENCOUNTER — Ambulatory Visit (INDEPENDENT_AMBULATORY_CARE_PROVIDER_SITE_OTHER): Payer: Medicaid Other | Admitting: Obstetrics

## 2013-03-13 ENCOUNTER — Encounter: Payer: Medicaid Other | Admitting: Obstetrics & Gynecology

## 2013-03-13 ENCOUNTER — Encounter: Payer: Self-pay | Admitting: Obstetrics & Gynecology

## 2013-03-13 VITALS — BP 141/84 | Temp 99.0°F | Wt 206.0 lb

## 2013-03-13 DIAGNOSIS — A499 Bacterial infection, unspecified: Secondary | ICD-10-CM

## 2013-03-13 DIAGNOSIS — Z3480 Encounter for supervision of other normal pregnancy, unspecified trimester: Secondary | ICD-10-CM

## 2013-03-13 DIAGNOSIS — Z3482 Encounter for supervision of other normal pregnancy, second trimester: Secondary | ICD-10-CM

## 2013-03-13 DIAGNOSIS — B9689 Other specified bacterial agents as the cause of diseases classified elsewhere: Secondary | ICD-10-CM

## 2013-03-13 DIAGNOSIS — N760 Acute vaginitis: Secondary | ICD-10-CM

## 2013-03-13 LAB — POCT URINALYSIS DIPSTICK
Glucose, UA: NEGATIVE
Leukocytes, UA: NEGATIVE
Nitrite, UA: NEGATIVE
Spec Grav, UA: 1.02
Urobilinogen, UA: NEGATIVE
pH, UA: 6

## 2013-03-13 MED ORDER — METRONIDAZOLE 500 MG PO TABS: 500.0000 mg | ORAL_TABLET | Freq: Two times a day (BID) | ORAL | Status: DC

## 2013-03-13 NOTE — Progress Notes (Signed)
HR - 87 Pt in office for routine OB visit, reports watery discharge with odor and irritation, concerned that she is not as big with this pregnancy as she was with her previous.

## 2013-04-03 ENCOUNTER — Encounter: Payer: Self-pay | Admitting: Obstetrics & Gynecology

## 2013-04-03 ENCOUNTER — Other Ambulatory Visit: Payer: Medicaid Other

## 2013-04-03 ENCOUNTER — Ambulatory Visit (INDEPENDENT_AMBULATORY_CARE_PROVIDER_SITE_OTHER): Payer: Medicaid Other | Admitting: Obstetrics & Gynecology

## 2013-04-03 VITALS — BP 120/83 | Temp 98.9°F | Wt 210.0 lb

## 2013-04-03 DIAGNOSIS — Z3482 Encounter for supervision of other normal pregnancy, second trimester: Secondary | ICD-10-CM

## 2013-04-03 DIAGNOSIS — Z3480 Encounter for supervision of other normal pregnancy, unspecified trimester: Secondary | ICD-10-CM

## 2013-04-03 DIAGNOSIS — Z98891 History of uterine scar from previous surgery: Secondary | ICD-10-CM

## 2013-04-03 LAB — POCT URINALYSIS DIPSTICK
Nitrite, UA: NEGATIVE
Urobilinogen, UA: NEGATIVE

## 2013-04-03 LAB — CBC
HCT: 35 % — ABNORMAL LOW (ref 36.0–46.0)
MCHC: 33.7 g/dL (ref 30.0–36.0)
MCV: 96.4 fL (ref 78.0–100.0)
RBC: 3.63 MIL/uL — ABNORMAL LOW (ref 3.87–5.11)
RDW: 13.2 % (ref 11.5–15.5)
WBC: 13.4 10*3/uL — ABNORMAL HIGH (ref 4.0–10.5)

## 2013-04-03 NOTE — Progress Notes (Deleted)
HR - 97 Pt in office today for routine OB visit, states she was unable to tolerate flagyl due to diarrhea and taste of med, symptoms have improved but still has vaginal irritation.  Subjective:    Leslie Mills is a 27 y.o. female being seen today for her obstetrical visit. She is at [redacted]w[redacted]d gestation. Patient reports {sx:14538}. Fetal movement: {fetal movement:14572}.  Menstrual History: OB History   Grav Para Term Preterm Abortions TAB SAB Ect Mult Living   3 1 1  1 1    1       Menarche age: *** Patient's last menstrual period was 10/08/2012.    {Common ambulatory SmartLinks:19316}  Review of Systems {ros; complete:30496}   Objective:    BP 120/83  Temp(Src) 98.9 F (37.2 C)  Wt 210 lb (95.255 kg)  LMP 10/08/2012 FHT: *** BPM  Uterine Size: {fundal height:14540}     Assessment:    Pregnancy {numbers; 0-42:17906} and {numbers; 0-7:15237}/7 weeks   Plan:    {ob counseling 24-27:14519} Follow up in {numbers 0-4:31231} weeks.

## 2013-04-04 LAB — RPR

## 2013-04-04 LAB — GLUCOSE TOLERANCE, 2 HOURS W/ 1HR: Glucose, 2 hour: 97 mg/dL (ref 70–139)

## 2013-04-04 LAB — HIV ANTIBODY (ROUTINE TESTING W REFLEX): HIV: NONREACTIVE

## 2013-04-05 NOTE — Progress Notes (Signed)
Doing well 

## 2013-04-09 ENCOUNTER — Other Ambulatory Visit: Payer: Self-pay | Admitting: *Deleted

## 2013-04-09 DIAGNOSIS — B9689 Other specified bacterial agents as the cause of diseases classified elsewhere: Secondary | ICD-10-CM

## 2013-04-09 MED ORDER — METRONIDAZOLE 500 MG PO TABS: 500.0000 mg | ORAL_TABLET | Freq: Two times a day (BID) | ORAL | Status: DC

## 2013-04-30 ENCOUNTER — Ambulatory Visit (INDEPENDENT_AMBULATORY_CARE_PROVIDER_SITE_OTHER): Payer: Medicaid Other | Admitting: Obstetrics & Gynecology

## 2013-04-30 VITALS — BP 135/89 | Temp 98.2°F | Wt 211.0 lb

## 2013-04-30 DIAGNOSIS — O139 Gestational [pregnancy-induced] hypertension without significant proteinuria, unspecified trimester: Secondary | ICD-10-CM

## 2013-04-30 DIAGNOSIS — Z3483 Encounter for supervision of other normal pregnancy, third trimester: Secondary | ICD-10-CM

## 2013-04-30 DIAGNOSIS — O133 Gestational [pregnancy-induced] hypertension without significant proteinuria, third trimester: Secondary | ICD-10-CM

## 2013-04-30 DIAGNOSIS — Z3480 Encounter for supervision of other normal pregnancy, unspecified trimester: Secondary | ICD-10-CM

## 2013-04-30 LAB — POCT URINALYSIS DIPSTICK
Bilirubin, UA: NEGATIVE
Ketones, UA: NEGATIVE
Leukocytes, UA: NEGATIVE
Nitrite, UA: NEGATIVE
Urobilinogen, UA: NEGATIVE
pH, UA: 6

## 2013-04-30 LAB — CBC
HCT: 35.3 % — ABNORMAL LOW (ref 36.0–46.0)
MCH: 32.3 pg (ref 26.0–34.0)
Platelets: 280 10*3/uL (ref 150–400)
RBC: 3.75 MIL/uL — ABNORMAL LOW (ref 3.87–5.11)
RDW: 13.2 % (ref 11.5–15.5)
WBC: 12.8 10*3/uL — ABNORMAL HIGH (ref 4.0–10.5)

## 2013-04-30 LAB — AST: AST: 10 U/L (ref 0–37)

## 2013-04-30 LAB — PROTEIN / CREATININE RATIO, URINE: Total Protein, Urine: 11 mg/dL

## 2013-04-30 LAB — LACTATE DEHYDROGENASE: LDH: 148 U/L (ref 94–250)

## 2013-04-30 LAB — ALT: ALT: 8 U/L (ref 0–35)

## 2013-04-30 MED ORDER — NIFEDIPINE ER OSMOTIC RELEASE 30 MG PO TB24: 30.0000 mg | ORAL_TABLET | Freq: Every day | ORAL | Status: DC

## 2013-04-30 NOTE — Patient Instructions (Signed)

## 2013-04-30 NOTE — Addendum Note (Signed)
Addended by: Henriette Combs on: 04/30/2013 03:20 PM   Modules accepted: Orders

## 2013-04-30 NOTE — Progress Notes (Signed)
Manual repeat blood pressure 142/90 left arm.  Likely chronic HTN.

## 2013-04-30 NOTE — Progress Notes (Signed)
Pulse 102 Pt is doing well.  Pt is still wondering about VBAC and getting tubal.

## 2013-05-01 NOTE — L&D Delivery Note (Signed)
Delivery Note At 10:38 AM a viable female was delivered via VBAC, Spontaneous (Presentation: ; Occiput Anterior).  APGAR: 9, 9   Placenta status: Intact, Spontaneous via Tomasa BlaseSchultz.  Cord: 3 vessels with the following complications: None; loose nuchal cord x e readily reduced.    Anesthesia: Epidural  Episiotomy: None Lacerations: 1st degree Suture Repair: 3.0 vicryl rapide Est. Blood Loss (mL): 200 ml  Mom to postpartum.  Baby to Couplet care / Skin to Skin.  JACKSON-MOORE,Usama Harkless A 07/10/2013, 10:58 AM

## 2013-05-02 LAB — PATHOLOGIST SMEAR REVIEW

## 2013-05-05 ENCOUNTER — Other Ambulatory Visit: Payer: Self-pay | Admitting: *Deleted

## 2013-05-05 DIAGNOSIS — O365990 Maternal care for other known or suspected poor fetal growth, unspecified trimester, not applicable or unspecified: Secondary | ICD-10-CM

## 2013-05-07 ENCOUNTER — Ambulatory Visit (INDEPENDENT_AMBULATORY_CARE_PROVIDER_SITE_OTHER): Payer: Medicaid Other | Admitting: Obstetrics & Gynecology

## 2013-05-07 ENCOUNTER — Ambulatory Visit (INDEPENDENT_AMBULATORY_CARE_PROVIDER_SITE_OTHER): Payer: Medicaid Other

## 2013-05-07 VITALS — BP 142/81 | Temp 97.8°F | Wt 211.0 lb

## 2013-05-07 DIAGNOSIS — Z3483 Encounter for supervision of other normal pregnancy, third trimester: Secondary | ICD-10-CM

## 2013-05-07 DIAGNOSIS — Z98891 History of uterine scar from previous surgery: Secondary | ICD-10-CM

## 2013-05-07 DIAGNOSIS — O365990 Maternal care for other known or suspected poor fetal growth, unspecified trimester, not applicable or unspecified: Secondary | ICD-10-CM

## 2013-05-07 DIAGNOSIS — Z3480 Encounter for supervision of other normal pregnancy, unspecified trimester: Secondary | ICD-10-CM

## 2013-05-07 DIAGNOSIS — O10013 Pre-existing essential hypertension complicating pregnancy, third trimester: Secondary | ICD-10-CM

## 2013-05-07 DIAGNOSIS — O10019 Pre-existing essential hypertension complicating pregnancy, unspecified trimester: Secondary | ICD-10-CM

## 2013-05-07 LAB — POCT URINALYSIS DIPSTICK
Bilirubin, UA: NEGATIVE
Blood, UA: NEGATIVE
GLUCOSE UA: NEGATIVE
KETONES UA: NEGATIVE
Leukocytes, UA: NEGATIVE
Nitrite, UA: NEGATIVE
Protein, UA: NEGATIVE
Spec Grav, UA: 1.01
Urobilinogen, UA: NEGATIVE
pH, UA: 7

## 2013-05-07 NOTE — Progress Notes (Signed)
Pulse- 106 Pt states she is having some lower abdomen pressure. Doing well.

## 2013-05-07 NOTE — Patient Instructions (Signed)
Heartburn During Pregnancy  Heartburn happens when stomach acid goes up into the esophagus. The esophagus is the tube between the mouth and the stomach. This acid causes a burning pain in the chest or throat. This happens more often in the later part of pregnancy because the womb (uterus) gets larger. It may also happen because of hormone changes. Heartburn problems often go away after giving birth. HOME CARE  Take all medicine as told by your doctor.  Raise the head of your bed with blocks only as told by your doctor.  Do not exercise right after eating.  Avoid eating 2 or 3 hours before bed. Do not lie down right after eating.  Eat small meals throughout the day instead of 3 large meals.  Avoid foods that give you heartburn. Foods you may want to avoid include:  Peppers.  Chocolate.  High-fat foods, including fried foods.  Spicy foods.  Garlic and onions.  Citrus fruits, including oranges, grapefruit, lemons, and limes.  Food containing tomatoes or tomato products.  Mint.  Bubbly (carbonated) drinks and drinks with caffeine.  Vinegar. GET HELP RIGHT AWAY IF:  You have bad chest pain that goes down your arm or into your jaw or neck.  You feel sweaty, dizzy, or lightheaded.  You have trouble breathing.  You throw up (vomit) blood.  You have trouble or pain when swallowing.  You have bloody or black poop (stool).  You have heartburn more than 3 times a week, for more than 2 weeks. MAKE SURE YOU:  Understand these instructions.  Will watch your condition.  Will get help right away if you are not doing well or get worse. Document Released: 05/20/2010 Document Revised: 07/10/2011 Document Reviewed: 12/04/2012 [facility] Patient Information 2014 [place], [place].

## 2013-05-14 ENCOUNTER — Ambulatory Visit (INDEPENDENT_AMBULATORY_CARE_PROVIDER_SITE_OTHER): Payer: Medicaid Other | Admitting: Obstetrics & Gynecology

## 2013-05-14 VITALS — BP 129/80 | Wt 219.0 lb

## 2013-05-14 DIAGNOSIS — Z3480 Encounter for supervision of other normal pregnancy, unspecified trimester: Secondary | ICD-10-CM

## 2013-05-14 LAB — POCT URINALYSIS DIPSTICK
BILIRUBIN UA: NEGATIVE
Blood, UA: NEGATIVE
Glucose, UA: NEGATIVE
Ketones, UA: NEGATIVE
LEUKOCYTES UA: NEGATIVE
Nitrite, UA: NEGATIVE
SPEC GRAV UA: 1.015
Urobilinogen, UA: NEGATIVE
pH, UA: 6

## 2013-05-14 NOTE — Patient Instructions (Signed)
Nonstress Test The nonstress test is a procedure that monitors the fetus's heartbeat. The test will monitor the heartbeat when the fetus is at rest and while the fetus is moving. In a healthy fetus, there will be an increase in fetal heart rate when the fetus moves or kicks. The heart rate will decrease at rest. This test helps determine if the fetus is healthy. The test may take up to a half hour. Your caregiver will look at a number of patterns in the heart rate tracing to make sure your baby is thriving. If there is concern, your caregiver may order additional tests or may suggest another course of action. This test is often done in the third trimester and can help determine if an early delivery is needed and safe. Common reasons to have this test are:  You are past your due date.  You have a high-risk pregnancy.  You are feeling less movement than normal.  You have lost a pregnancy in the past.  Your caregiver suspects fetal growth problems.  You have too much or too little amniotic fluid. BEFORE THE PROCEDURE  Eat a meal right before the test or as directed by your caregiver. Food may help stimulate fetal movements.  Use the restroom right before the test. PROCEDURE  Two belts will be placed around your abdomen. These belts have monitors attached to them. One records the fetal heart rate and the other records uterine contractions.  You may be asked to lie down on your side or to stay sitting upright.  You may be given a button to press when you feel movement.  The fetal heartbeat is listened to and watched on a screen. The heartbeat is recorded on a sheet of paper.  If the fetus seems to be sleeping, you may be asked to drink some juice or soda, gently press your abdomen, or make some noise to wake the fetus. AFTER THE PROCEDURE  Your caregiver will discuss the test results with you and make recommendations for the near future. Document Released: 04/07/2002 Document Revised:  08/12/2012 Document Reviewed: 05/21/2012 [employer] Patient Information 2014 [employer].  

## 2013-05-14 NOTE — Progress Notes (Signed)
Doing well.  Start NSTs next week.

## 2013-05-21 ENCOUNTER — Ambulatory Visit (INDEPENDENT_AMBULATORY_CARE_PROVIDER_SITE_OTHER): Payer: Medicaid Other | Admitting: Obstetrics & Gynecology

## 2013-05-21 VITALS — BP 141/85 | Temp 98.1°F | Wt 212.0 lb

## 2013-05-21 DIAGNOSIS — Z3480 Encounter for supervision of other normal pregnancy, unspecified trimester: Secondary | ICD-10-CM

## 2013-05-21 DIAGNOSIS — O10019 Pre-existing essential hypertension complicating pregnancy, unspecified trimester: Secondary | ICD-10-CM

## 2013-05-21 NOTE — Progress Notes (Signed)
Pulse: 103 Patient states she is feeling slight pressure.

## 2013-05-23 NOTE — Patient Instructions (Signed)
Third Trimester of Pregnancy  The third trimester is from week 29 through week 42, months 7 through 9. The third trimester is a time when the fetus is growing rapidly. At the end of the ninth month, the fetus is about 20 inches in length and weighs 6 10 pounds.   BODY CHANGES  Your body goes through many changes during pregnancy. The changes vary from woman to woman.    Your weight will continue to increase. You can expect to gain 25 35 pounds (11 16 kg) by the end of the pregnancy.   You may begin to get stretch marks on your hips, abdomen, and breasts.   You may urinate more often because the fetus is moving lower into your pelvis and pressing on your bladder.   You may develop or continue to have heartburn as a result of your pregnancy.   You may develop constipation because certain hormones are causing the muscles that push waste through your intestines to slow down.   You may develop hemorrhoids or swollen, bulging veins (varicose veins).   You may have pelvic pain because of the weight gain and pregnancy hormones relaxing your joints between the bones in your pelvis. Back aches may result from over exertion of the muscles supporting your posture.   Your breasts will continue to grow and be tender. A yellow discharge may leak from your breasts called colostrum.   Your belly button may stick out.   You may feel short of breath because of your expanding uterus.   You may notice the fetus "dropping," or moving lower in your abdomen.   You may have a bloody mucus discharge. This usually occurs a few days to a week before labor begins.   Your cervix becomes thin and soft (effaced) near your due date.  WHAT TO EXPECT AT YOUR PRENATAL EXAMS   You will have prenatal exams every 2 weeks until week 36. Then, you will have weekly prenatal exams. During a routine prenatal visit:   You will be weighed to make sure you and the fetus are growing normally.   Your blood pressure is taken.   Your abdomen will be  measured to track your baby's growth.   The fetal heartbeat will be listened to.   Any test results from the previous visit will be discussed.   You may have a cervical check near your due date to see if you have effaced.  At around 36 weeks, your caregiver will check your cervix. At the same time, your caregiver will also perform a test on the secretions of the vaginal tissue. This test is to determine if a type of bacteria, Group B streptococcus, is present. Your caregiver will explain this further.  Your caregiver may ask you:   What your birth plan is.   How you are feeling.   If you are feeling the baby move.   If you have had any abnormal symptoms, such as leaking fluid, bleeding, severe headaches, or abdominal cramping.   If you have any questions.  Other tests or screenings that may be performed during your third trimester include:   Blood tests that check for low iron levels (anemia).   Fetal testing to check the health, activity level, and growth of the fetus. Testing is done if you have certain medical conditions or if there are problems during the pregnancy.  FALSE LABOR  You may feel small, irregular contractions that eventually go away. These are called Braxton Hicks contractions, or   false labor. Contractions may last for hours, days, or even weeks before true labor sets in. If contractions come at regular intervals, intensify, or become painful, it is best to be seen by your caregiver.   SIGNS OF LABOR    Menstrual-like cramps.   Contractions that are 5 minutes apart or less.   Contractions that start on the top of the uterus and spread down to the lower abdomen and back.   A sense of increased pelvic pressure or back pain.   A watery or bloody mucus discharge that comes from the vagina.  If you have any of these signs before the 37th week of pregnancy, call your caregiver right away. You need to go to the hospital to get checked immediately.  HOME CARE INSTRUCTIONS    Avoid all  smoking, herbs, alcohol, and unprescribed drugs. These chemicals affect the formation and growth of the baby.   Follow your caregiver's instructions regarding medicine use. There are medicines that are either safe or unsafe to take during pregnancy.   Exercise only as directed by your caregiver. Experiencing uterine cramps is a good sign to stop exercising.   Continue to eat regular, healthy meals.   Wear a good support bra for breast tenderness.   Do not use hot tubs, steam rooms, or saunas.   Wear your seat belt at all times when driving.   Avoid raw meat, uncooked cheese, cat litter boxes, and soil used by cats. These carry germs that can cause birth defects in the baby.   Take your prenatal vitamins.   Try taking a stool softener (if your caregiver approves) if you develop constipation. Eat more high-fiber foods, such as fresh vegetables or fruit and whole grains. Drink plenty of fluids to keep your urine clear or pale yellow.   Take warm sitz baths to soothe any pain or discomfort caused by hemorrhoids. Use hemorrhoid cream if your caregiver approves.   If you develop varicose veins, wear support hose. Elevate your feet for 15 minutes, 3 4 times a day. Limit salt in your diet.   Avoid heavy lifting, wear low heal shoes, and practice good posture.   Rest a lot with your legs elevated if you have leg cramps or low back pain.   Visit your dentist if you have not gone during your pregnancy. Use a soft toothbrush to brush your teeth and be gentle when you floss.   A sexual relationship may be continued unless your caregiver directs you otherwise.   Do not travel far distances unless it is absolutely necessary and only with the approval of your caregiver.   Take prenatal classes to understand, practice, and ask questions about the labor and delivery.   Make a trial run to the hospital.   Pack your hospital bag.   Prepare the baby's nursery.   Continue to go to all your prenatal visits as directed  by your caregiver.  SEEK MEDICAL CARE IF:   You are unsure if you are in labor or if your water has broken.   You have dizziness.   You have mild pelvic cramps, pelvic pressure, or nagging pain in your abdominal area.   You have persistent nausea, vomiting, or diarrhea.   You have a bad smelling vaginal discharge.   You have pain with urination.  SEEK IMMEDIATE MEDICAL CARE IF:    You have a fever.   You are leaking fluid from your vagina.   You have spotting or bleeding from your vagina.     You have severe abdominal cramping or pain.   You have rapid weight loss or gain.   You have shortness of breath with chest pain.   You notice sudden or extreme swelling of your face, hands, ankles, feet, or legs.   You have not felt your baby move in over an hour.   You have severe headaches that do not go away with medicine.   You have vision changes.  Document Released: 04/11/2001 Document Revised: 12/18/2012 Document Reviewed: 06/18/2012  [employer] Patient Information 2014 [employer].

## 2013-05-27 LAB — US OB FOLLOW UP

## 2013-05-29 ENCOUNTER — Ambulatory Visit (INDEPENDENT_AMBULATORY_CARE_PROVIDER_SITE_OTHER): Payer: Medicaid Other | Admitting: Obstetrics & Gynecology

## 2013-05-29 VITALS — BP 132/86 | Temp 98.2°F | Wt 215.0 lb

## 2013-05-29 DIAGNOSIS — O10019 Pre-existing essential hypertension complicating pregnancy, unspecified trimester: Secondary | ICD-10-CM

## 2013-05-29 DIAGNOSIS — Z3480 Encounter for supervision of other normal pregnancy, unspecified trimester: Secondary | ICD-10-CM

## 2013-05-29 LAB — POCT URINALYSIS DIPSTICK
Bilirubin, UA: NEGATIVE
Blood, UA: NEGATIVE
GLUCOSE UA: NEGATIVE
KETONES UA: NEGATIVE
LEUKOCYTES UA: NEGATIVE
NITRITE UA: NEGATIVE
Protein, UA: NEGATIVE
Spec Grav, UA: 1.01
Urobilinogen, UA: NEGATIVE
pH, UA: 6

## 2013-05-29 NOTE — Patient Instructions (Signed)
Third Trimester of Pregnancy  The third trimester is from week 29 through week 42, months 7 through 9. The third trimester is a time when the fetus is growing rapidly. At the end of the ninth month, the fetus is about 20 inches in length and weighs 6 10 pounds.   BODY CHANGES  Your body goes through many changes during pregnancy. The changes vary from woman to woman.    Your weight will continue to increase. You can expect to gain 25 35 pounds (11 16 kg) by the end of the pregnancy.   You may begin to get stretch marks on your hips, abdomen, and breasts.   You may urinate more often because the fetus is moving lower into your pelvis and pressing on your bladder.   You may develop or continue to have heartburn as a result of your pregnancy.   You may develop constipation because certain hormones are causing the muscles that push waste through your intestines to slow down.   You may develop hemorrhoids or swollen, bulging veins (varicose veins).   You may have pelvic pain because of the weight gain and pregnancy hormones relaxing your joints between the bones in your pelvis. Back aches may result from over exertion of the muscles supporting your posture.   Your breasts will continue to grow and be tender. A yellow discharge may leak from your breasts called colostrum.   Your belly button may stick out.   You may feel short of breath because of your expanding uterus.   You may notice the fetus "dropping," or moving lower in your abdomen.   You may have a bloody mucus discharge. This usually occurs a few days to a week before labor begins.   Your cervix becomes thin and soft (effaced) near your due date.  WHAT TO EXPECT AT YOUR PRENATAL EXAMS   You will have prenatal exams every 2 weeks until week 36. Then, you will have weekly prenatal exams. During a routine prenatal visit:   You will be weighed to make sure you and the fetus are growing normally.   Your blood pressure is taken.   Your abdomen will be  measured to track your baby's growth.   The fetal heartbeat will be listened to.   Any test results from the previous visit will be discussed.   You may have a cervical check near your due date to see if you have effaced.  At around 36 weeks, your caregiver will check your cervix. At the same time, your caregiver will also perform a test on the secretions of the vaginal tissue. This test is to determine if a type of bacteria, Group B streptococcus, is present. Your caregiver will explain this further.  Your caregiver may ask you:   What your birth plan is.   How you are feeling.   If you are feeling the baby move.   If you have had any abnormal symptoms, such as leaking fluid, bleeding, severe headaches, or abdominal cramping.   If you have any questions.  Other tests or screenings that may be performed during your third trimester include:   Blood tests that check for low iron levels (anemia).   Fetal testing to check the health, activity level, and growth of the fetus. Testing is done if you have certain medical conditions or if there are problems during the pregnancy.  FALSE LABOR  You may feel small, irregular contractions that eventually go away. These are called Braxton Hicks contractions, or   false labor. Contractions may last for hours, days, or even weeks before true labor sets in. If contractions come at regular intervals, intensify, or become painful, it is best to be seen by your caregiver.   SIGNS OF LABOR    Menstrual-like cramps.   Contractions that are 5 minutes apart or less.   Contractions that start on the top of the uterus and spread down to the lower abdomen and back.   A sense of increased pelvic pressure or back pain.   A watery or bloody mucus discharge that comes from the vagina.  If you have any of these signs before the 37th week of pregnancy, call your caregiver right away. You need to go to the hospital to get checked immediately.  HOME CARE INSTRUCTIONS    Avoid all  smoking, herbs, alcohol, and unprescribed drugs. These chemicals affect the formation and growth of the baby.   Follow your caregiver's instructions regarding medicine use. There are medicines that are either safe or unsafe to take during pregnancy.   Exercise only as directed by your caregiver. Experiencing uterine cramps is a good sign to stop exercising.   Continue to eat regular, healthy meals.   Wear a good support bra for breast tenderness.   Do not use hot tubs, steam rooms, or saunas.   Wear your seat belt at all times when driving.   Avoid raw meat, uncooked cheese, cat litter boxes, and soil used by cats. These carry germs that can cause birth defects in the baby.   Take your prenatal vitamins.   Try taking a stool softener (if your caregiver approves) if you develop constipation. Eat more high-fiber foods, such as fresh vegetables or fruit and whole grains. Drink plenty of fluids to keep your urine clear or pale yellow.   Take warm sitz baths to soothe any pain or discomfort caused by hemorrhoids. Use hemorrhoid cream if your caregiver approves.   If you develop varicose veins, wear support hose. Elevate your feet for 15 minutes, 3 4 times a day. Limit salt in your diet.   Avoid heavy lifting, wear low heal shoes, and practice good posture.   Rest a lot with your legs elevated if you have leg cramps or low back pain.   Visit your dentist if you have not gone during your pregnancy. Use a soft toothbrush to brush your teeth and be gentle when you floss.   A sexual relationship may be continued unless your caregiver directs you otherwise.   Do not travel far distances unless it is absolutely necessary and only with the approval of your caregiver.   Take prenatal classes to understand, practice, and ask questions about the labor and delivery.   Make a trial run to the hospital.   Pack your hospital bag.   Prepare the baby's nursery.   Continue to go to all your prenatal visits as directed  by your caregiver.  SEEK MEDICAL CARE IF:   You are unsure if you are in labor or if your water has broken.   You have dizziness.   You have mild pelvic cramps, pelvic pressure, or nagging pain in your abdominal area.   You have persistent nausea, vomiting, or diarrhea.   You have a bad smelling vaginal discharge.   You have pain with urination.  SEEK IMMEDIATE MEDICAL CARE IF:    You have a fever.   You are leaking fluid from your vagina.   You have spotting or bleeding from your vagina.     You have severe abdominal cramping or pain.   You have rapid weight loss or gain.   You have shortness of breath with chest pain.   You notice sudden or extreme swelling of your face, hands, ankles, feet, or legs.   You have not felt your baby move in over an hour.   You have severe headaches that do not go away with medicine.   You have vision changes.  Document Released: 04/11/2001 Document Revised: 12/18/2012 Document Reviewed: 06/18/2012  [employer] Patient Information 2014 [employer].

## 2013-05-29 NOTE — Progress Notes (Signed)
Pulse 108 Pt is doing well

## 2013-06-03 ENCOUNTER — Other Ambulatory Visit: Payer: Self-pay | Admitting: *Deleted

## 2013-06-03 DIAGNOSIS — O365990 Maternal care for other known or suspected poor fetal growth, unspecified trimester, not applicable or unspecified: Secondary | ICD-10-CM

## 2013-06-04 ENCOUNTER — Ambulatory Visit (INDEPENDENT_AMBULATORY_CARE_PROVIDER_SITE_OTHER): Payer: Medicaid Other | Admitting: Obstetrics & Gynecology

## 2013-06-04 ENCOUNTER — Encounter: Payer: Self-pay | Admitting: Obstetrics & Gynecology

## 2013-06-04 ENCOUNTER — Ambulatory Visit (INDEPENDENT_AMBULATORY_CARE_PROVIDER_SITE_OTHER): Payer: Medicaid Other

## 2013-06-04 VITALS — BP 118/85 | Temp 98.5°F | Wt 216.0 lb

## 2013-06-04 DIAGNOSIS — O10019 Pre-existing essential hypertension complicating pregnancy, unspecified trimester: Secondary | ICD-10-CM

## 2013-06-04 DIAGNOSIS — O365990 Maternal care for other known or suspected poor fetal growth, unspecified trimester, not applicable or unspecified: Secondary | ICD-10-CM

## 2013-06-04 DIAGNOSIS — Z3480 Encounter for supervision of other normal pregnancy, unspecified trimester: Secondary | ICD-10-CM

## 2013-06-04 DIAGNOSIS — O10919 Unspecified pre-existing hypertension complicating pregnancy, unspecified trimester: Secondary | ICD-10-CM

## 2013-06-04 LAB — POCT URINALYSIS DIPSTICK
Bilirubin, UA: NEGATIVE
Glucose, UA: NEGATIVE
KETONES UA: NEGATIVE
Leukocytes, UA: NEGATIVE
Nitrite, UA: NEGATIVE
PH UA: 6
PROTEIN UA: NEGATIVE
RBC UA: NEGATIVE
SPEC GRAV UA: 1.015
UROBILINOGEN UA: NEGATIVE

## 2013-06-04 LAB — US OB DETAIL + 14 WK

## 2013-06-04 NOTE — Patient Instructions (Signed)
Fetal Movement Counts Patient Name: __________________________________________________ Patient Due Date: ____________________ Performing a fetal movement count is highly recommended in high-risk pregnancies, but it is good for every pregnant woman to do. Your caregiver may ask you to start counting fetal movements at 28 weeks of the pregnancy. Fetal movements often increase:  After eating a full meal.  After physical activity.  After eating or drinking something sweet or cold.  At rest. Pay attention to when you feel the baby is most active. This will help you notice a pattern of your baby's sleep and wake cycles and what factors contribute to an increase in fetal movement. It is important to perform a fetal movement count at the same time each day when your baby is normally most active.  HOW TO COUNT FETAL MOVEMENTS 1. Find a quiet and comfortable area to sit or lie down on your left side. Lying on your left side provides the best blood and oxygen circulation to your baby. 2. Write down the day and time on a sheet of paper or in a journal. 3. Start counting kicks, flutters, swishes, rolls, or jabs in a 2 hour period. You should feel at least 10 movements within 2 hours. 4. If you do not feel 10 movements in 2 hours, wait 2 3 hours and count again. Look for a change in the pattern or not enough counts in 2 hours. SEEK MEDICAL CARE IF:  You feel less than 10 counts in 2 hours, tried twice.  There is no movement in over an hour.  The pattern is changing or taking longer each day to reach 10 counts in 2 hours.  You feel the baby is not moving as he or she usually does. Date: ____________ Movements: ____________ Start time: ____________ Finish time: ____________  Date: ____________ Movements: ____________ Start time: ____________ Finish time: ____________ Date: ____________ Movements: ____________ Start time: ____________ Finish time: ____________ Date: ____________ Movements: ____________  Start time: ____________ Finish time: ____________ Date: ____________ Movements: ____________ Start time: ____________ Finish time: ____________ Date: ____________ Movements: ____________ Start time: ____________ Finish time: ____________ Date: ____________ Movements: ____________ Start time: ____________ Finish time: ____________ Date: ____________ Movements: ____________ Start time: ____________ Finish time: ____________  Date: ____________ Movements: ____________ Start time: ____________ Finish time: ____________ Date: ____________ Movements: ____________ Start time: ____________ Finish time: ____________ Date: ____________ Movements: ____________ Start time: ____________ Finish time: ____________ Date: ____________ Movements: ____________ Start time: ____________ Finish time: ____________ Date: ____________ Movements: ____________ Start time: ____________ Finish time: ____________ Date: ____________ Movements: ____________ Start time: ____________ Finish time: ____________ Date: ____________ Movements: ____________ Start time: ____________ Finish time: ____________  Date: ____________ Movements: ____________ Start time: ____________ Finish time: ____________ Date: ____________ Movements: ____________ Start time: ____________ Finish time: ____________ Date: ____________ Movements: ____________ Start time: ____________ Finish time: ____________ Date: ____________ Movements: ____________ Start time: ____________ Finish time: ____________ Date: ____________ Movements: ____________ Start time: ____________ Finish time: ____________ Date: ____________ Movements: ____________ Start time: ____________ Finish time: ____________ Date: ____________ Movements: ____________ Start time: ____________ Finish time: ____________  Date: ____________ Movements: ____________ Start time: ____________ Finish time: ____________ Date: ____________ Movements: ____________ Start time: ____________ Finish time:  ____________ Date: ____________ Movements: ____________ Start time: ____________ Finish time: ____________ Date: ____________ Movements: ____________ Start time: ____________ Finish time: ____________ Date: ____________ Movements: ____________ Start time: ____________ Finish time: ____________ Date: ____________ Movements: ____________ Start time: ____________ Finish time: ____________ Date: ____________ Movements: ____________ Start time: ____________ Finish time: ____________  Date: ____________ Movements: ____________ Start time: ____________ Finish   time: ____________ Date: ____________ Movements: ____________ Start time: ____________ Finish time: ____________ Date: ____________ Movements: ____________ Start time: ____________ Finish time: ____________ Date: ____________ Movements: ____________ Start time: ____________ Finish time: ____________ Date: ____________ Movements: ____________ Start time: ____________ Finish time: ____________ Date: ____________ Movements: ____________ Start time: ____________ Finish time: ____________ Date: ____________ Movements: ____________ Start time: ____________ Finish time: ____________  Date: ____________ Movements: ____________ Start time: ____________ Finish time: ____________ Date: ____________ Movements: ____________ Start time: ____________ Finish time: ____________ Date: ____________ Movements: ____________ Start time: ____________ Finish time: ____________ Date: ____________ Movements: ____________ Start time: ____________ Finish time: ____________ Date: ____________ Movements: ____________ Start time: ____________ Finish time: ____________ Date: ____________ Movements: ____________ Start time: ____________ Finish time: ____________ Date: ____________ Movements: ____________ Start time: ____________ Finish time: ____________  Date: ____________ Movements: ____________ Start time: ____________ Finish time: ____________ Date: ____________ Movements:  ____________ Start time: ____________ Finish time: ____________ Date: ____________ Movements: ____________ Start time: ____________ Finish time: ____________ Date: ____________ Movements: ____________ Start time: ____________ Finish time: ____________ Date: ____________ Movements: ____________ Start time: ____________ Finish time: ____________ Date: ____________ Movements: ____________ Start time: ____________ Finish time: ____________ Date: ____________ Movements: ____________ Start time: ____________ Finish time: ____________  Date: ____________ Movements: ____________ Start time: ____________ Finish time: ____________ Date: ____________ Movements: ____________ Start time: ____________ Finish time: ____________ Date: ____________ Movements: ____________ Start time: ____________ Finish time: ____________ Date: ____________ Movements: ____________ Start time: ____________ Finish time: ____________ Date: ____________ Movements: ____________ Start time: ____________ Finish time: ____________ Date: ____________ Movements: ____________ Start time: ____________ Finish time: ____________ Document Released: 05/17/2006 Document Revised: 04/03/2012 Document Reviewed: 02/12/2012 [employer] Patient Information 2014 [employer].  

## 2013-06-04 NOTE — Progress Notes (Signed)
Pulse: 102 Patient denies any concerns.

## 2013-06-11 ENCOUNTER — Ambulatory Visit (INDEPENDENT_AMBULATORY_CARE_PROVIDER_SITE_OTHER): Payer: Medicaid Other | Admitting: Obstetrics & Gynecology

## 2013-06-11 VITALS — BP 132/85 | Temp 98.3°F | Wt 215.0 lb

## 2013-06-11 DIAGNOSIS — O10019 Pre-existing essential hypertension complicating pregnancy, unspecified trimester: Secondary | ICD-10-CM

## 2013-06-11 DIAGNOSIS — Z3480 Encounter for supervision of other normal pregnancy, unspecified trimester: Secondary | ICD-10-CM

## 2013-06-11 LAB — POCT URINALYSIS DIPSTICK
Bilirubin, UA: NEGATIVE
Blood, UA: NEGATIVE
GLUCOSE UA: NEGATIVE
Ketones, UA: NEGATIVE
LEUKOCYTES UA: NEGATIVE
NITRITE UA: NEGATIVE
Spec Grav, UA: 1.02
UROBILINOGEN UA: NEGATIVE
pH, UA: 6

## 2013-06-11 NOTE — Progress Notes (Signed)
Pulse 108 Pt states that she is still having severe heartburn.  Pt states that comfort measures are no longer working.  Pt also states that she may have passed her mucous plug.  Pt had thick mucous to pass on last Friday 06/06/13. Pt has since had light stingy mucous d/c.

## 2013-06-13 LAB — STREP B DNA PROBE: STREP GROUP B AG: NEGATIVE

## 2013-06-13 NOTE — Patient Instructions (Signed)
Patient information: Group B streptococcus and pregnancy (Beyond the Basics)  Authors Karen M Puopolo, MD, PhD Carol J Baker, MD Section Editors Charles J Lockwood, MD Daniel J Sexton, MD Deputy Editor Vanessa A Barss, MD Disclosures  All topics are updated as new evidence becomes available and our peer review process is complete.  Literature review current through: Feb 2014.  This topic last updated: Oct 30, 2011.  INTRODUCTION - Group B streptococcus (GBS) is a bacterium that can cause serious infections in pregnant women and newborn babies. GBS is one of many types of streptococcal bacteria, sometimes called "strep." This article discusses GBS, its effect on pregnant women and infants, and ways to prevent complications of GBS. More detailed information about GBS is available by subscription. (See "Group B streptococcal infection in pregnant women".) WHAT IS GROUP B STREP INFECTION? - GBS is commonly found in the digestive system and the vagina. In healthy adults, GBS is not harmful and does not cause problems. But in pregnant women and newborn infants, being infected with GBS can cause serious illness. Approximately one in three to four pregnant women in the [place] carries GBS in their gastrointestinal system and/or in their vagina. Carrying GBS is not the same as being infected. Carriers are not sick and do not need treatment during pregnancy. There is no treatment that can stop you from carrying GBS.  Pregnant women who are carriers of GBS infrequently become infected with GBS. GBS can cause urinary tract infections, infection of the amniotic fluid (bag of water), and infection of the uterus after delivery. GBS infections during pregnancy may lead to preterm labor.  Pregnant women who carry GBS can pass on the bacteria to their newborns, and some of those babies become infected with GBS. Newborns who are infected with GBS can develop pneumonia (lung infection), septicemia (blood infection), or  meningitis (infection of the lining of the brain and spinal cord). These complications can be prevented by giving intravenous antibiotics during labor to any woman who is at risk of GBS infection. You are at risk of GBS infection if: You have a urine culture during your current pregnancy showing GBS  You have a vaginal and rectal culture during your current pregnancy showing GBS  You had an infant infected with GBS in the past GROUP B STREP PREVENTION - Most doctors and nurses recommend a urine culture early in your pregnancy to be sure that you do not have a bladder infection without symptoms. If you urine culture shows GBS or other bacteria, you may be treated with an antibiotic. If you have symptoms of urinary infection, such as pain with urination, any time during your pregnancy, a urine culture is done. If GBS grows from the urine culture, it should be treated with an antibiotic, and you should also receive intravenous antibiotics during labor. Expert groups recommend that all pregnant women have a GBS culture at 35 to 37 weeks of pregnancy. The culture is done by swabbing the vagina and rectum. If your GBS culture is positive, you will be given an intravenous antibiotic during labor. If you have preterm labor, the culture is done then and an intravenous antibiotic is given until the baby is born or the labor is stopped by your health care provider. If you have a positive GBS culture and you have an allergy to penicillin, be sure your doctor and nurse are aware of this allergy and tell them what happened with the allergy. If you had only a rash or itching, this   is not a serious allergy, and you can receive a common drug related to the penicillin. If you had a serious allergy (for example, trouble breathing, swelling of your face) you may need an additional test to determine which antibiotic should be used during labor. Being treated with an antibiotic during labor greatly reduces the chance that you or  your newborn will develop infections related to GBS. It is important to note that young infants up to age 3 months can also develop septicemia, meningitis and other serious infections from GBS. Being treated with an antibiotic during labor does not reduce the chance that your baby will develop this later type of infection. There is currently no known way of preventing this later-onset GBS disease. WHERE TO GET MORE INFORMATION - Your healthcare provider is the best source of information for questions and concerns related to your medical problem.  

## 2013-06-19 ENCOUNTER — Ambulatory Visit (INDEPENDENT_AMBULATORY_CARE_PROVIDER_SITE_OTHER): Payer: Medicaid Other | Admitting: Obstetrics & Gynecology

## 2013-06-19 ENCOUNTER — Encounter: Payer: Medicaid Other | Admitting: Obstetrics & Gynecology

## 2013-06-19 VITALS — BP 140/83 | Temp 98.1°F | Wt 217.0 lb

## 2013-06-19 DIAGNOSIS — Z3480 Encounter for supervision of other normal pregnancy, unspecified trimester: Secondary | ICD-10-CM

## 2013-06-19 DIAGNOSIS — O139 Gestational [pregnancy-induced] hypertension without significant proteinuria, unspecified trimester: Secondary | ICD-10-CM

## 2013-06-19 LAB — POCT URINALYSIS DIPSTICK
Bilirubin, UA: NEGATIVE
Blood, UA: NEGATIVE
Glucose, UA: NEGATIVE
Ketones, UA: NEGATIVE
LEUKOCYTES UA: NEGATIVE
NITRITE UA: NEGATIVE
PH UA: 6
PROTEIN UA: NEGATIVE
Spec Grav, UA: 1.015
Urobilinogen, UA: NEGATIVE

## 2013-06-19 NOTE — Progress Notes (Signed)
Pulse- 105 Patient states she is having pelvic pressure.

## 2013-06-26 ENCOUNTER — Encounter: Payer: Medicaid Other | Admitting: Obstetrics & Gynecology

## 2013-06-30 ENCOUNTER — Ambulatory Visit (INDEPENDENT_AMBULATORY_CARE_PROVIDER_SITE_OTHER): Payer: Medicaid Other | Admitting: Obstetrics & Gynecology

## 2013-06-30 VITALS — BP 137/82 | Temp 97.7°F | Wt 217.0 lb

## 2013-06-30 DIAGNOSIS — O139 Gestational [pregnancy-induced] hypertension without significant proteinuria, unspecified trimester: Secondary | ICD-10-CM

## 2013-06-30 DIAGNOSIS — Z3480 Encounter for supervision of other normal pregnancy, unspecified trimester: Secondary | ICD-10-CM

## 2013-06-30 LAB — POCT URINALYSIS DIPSTICK
Bilirubin, UA: NEGATIVE
GLUCOSE UA: NEGATIVE
Ketones, UA: NEGATIVE
Leukocytes, UA: NEGATIVE
Nitrite, UA: NEGATIVE
Protein, UA: NEGATIVE
RBC UA: NEGATIVE
SPEC GRAV UA: 1.01
UROBILINOGEN UA: NEGATIVE
pH, UA: 7

## 2013-06-30 NOTE — Progress Notes (Signed)
Pulse: 86 Patient states she is having pelvic pressure. Patient states that for the past 2 days she has had diarrhea. Patient states she still having the heartburn and indigestion.

## 2013-06-30 NOTE — Patient Instructions (Signed)
Labor Induction  Labor induction is when steps are taken to cause a pregnant woman to begin the labor process. Most women go into labor on their own between 37 weeks and 42 weeks of the pregnancy. When this does not happen or when there is a medical need, methods may be used to induce labor. Labor induction causes a pregnant woman's uterus to contract. It also causes the cervix to soften (ripen), open (dilate), and thin out (efface). Usually, labor is not induced before 39 weeks of the pregnancy unless there is a problem with the baby or mother.  Before inducing labor, your health care provider will consider a number of factors, including the following:  The medical condition of you and the baby.   How many weeks along you are.   The status of the baby's lung maturity.   The condition of the cervix.   The position of the baby.  WHAT ARE THE REASONS FOR LABOR INDUCTION? Labor may be induced for the following reasons:  The health of the baby or mother is at risk.   The pregnancy is overdue by 1 week or more.   The water breaks but labor does not start on its own.   The mother has a health condition or serious illness, such as high blood pressure, infection, placental abruption, or diabetes.  The amniotic fluid amounts are low around the baby.   The baby is distressed.  Convenience or wanting the baby to be born on a certain date is not a reason for inducing labor. WHAT METHODS ARE USED FOR LABOR INDUCTION? Several methods of labor induction may be used, such as:   Prostaglandin medicine. This medicine causes the cervix to dilate and ripen. The medicine will also start contractions. It can be taken by mouth or by inserting a suppository into the vagina.   Inserting a thin tube (catheter) with a balloon on the end into the vagina to dilate the cervix. Once inserted, the balloon is expanded with water, which causes the cervix to open.   Stripping the membranes. Your health  care provider separates amniotic sac tissue from the cervix, causing the cervix to be stretched and causing the release of a hormone called progesterone. This may cause the uterus to contract. It is often done during an office visit. You will be sent home to wait for the contractions to begin. You will then come in for an induction.   Breaking the water. Your health care provider makes a hole in the amniotic sac using a small instrument. Once the amniotic sac breaks, contractions should begin. This may still take hours to see an effect.   Medicine to trigger or strengthen contractions. This medicine is given through an IV access tube inserted into a vein in your arm.  All of the methods of induction, besides stripping the membranes, will be done in the hospital. Induction is done in the hospital so that you and the baby can be carefully monitored.  HOW LONG DOES IT TAKE FOR LABOR TO BE INDUCED? Some inductions can take up to 2 3 days. Depending on the cervix, it usually takes less time. It takes longer when you are induced early in the pregnancy or if this is your first pregnancy. If a mother is still pregnant and the induction has been going on for 2 3 days, either the mother will be sent home or a cesarean delivery will be needed. WHAT ARE THE RISKS ASSOCIATED WITH LABOR INDUCTION? Some of the risks   of induction include:   Changes in fetal heart rate, such as too high, too low, or erratic.   Fetal distress.   Chance of infection for the mother and baby.   Increased chance of having a cesarean delivery.   Breaking off (abruption) of the placenta from the uterus (rare).   Uterine rupture (very rare).  When induction is needed for medical reasons, the benefits of induction may outweigh the risks. WHAT ARE SOME REASONS FOR NOT INDUCING LABOR? Labor induction should not be done if:   It is shown that your baby does not tolerate labor.   You have had previous surgeries on your  uterus, such as a myomectomy or the removal of fibroids.   Your placenta lies very low in the uterus and blocks the opening of the cervix (placenta previa).   Your baby is not in a head-down position.   The umbilical cord drops down into the birth canal in front of the baby. This could cut off the baby's blood and oxygen supply.   You have had a previous cesarean delivery.   There are unusual circumstances, such as the baby being extremely premature.  Document Released: 09/06/2006 Document Revised: 12/18/2012 Document Reviewed: 11/14/2012 [employer] Patient Information 2014 [employer].  

## 2013-07-01 ENCOUNTER — Telehealth (HOSPITAL_COMMUNITY): Payer: Self-pay | Admitting: *Deleted

## 2013-07-01 ENCOUNTER — Encounter (HOSPITAL_COMMUNITY): Payer: Self-pay | Admitting: *Deleted

## 2013-07-01 NOTE — Telephone Encounter (Signed)
Preadmission screen  

## 2013-07-07 ENCOUNTER — Ambulatory Visit (INDEPENDENT_AMBULATORY_CARE_PROVIDER_SITE_OTHER): Payer: Medicaid Other | Admitting: Obstetrics & Gynecology

## 2013-07-07 VITALS — BP 143/85 | Temp 98.6°F | Wt 218.0 lb

## 2013-07-07 DIAGNOSIS — O10019 Pre-existing essential hypertension complicating pregnancy, unspecified trimester: Secondary | ICD-10-CM

## 2013-07-07 DIAGNOSIS — Z3480 Encounter for supervision of other normal pregnancy, unspecified trimester: Secondary | ICD-10-CM

## 2013-07-07 LAB — POCT URINALYSIS DIPSTICK
Bilirubin, UA: NEGATIVE
GLUCOSE UA: NEGATIVE
KETONES UA: NEGATIVE
Leukocytes, UA: NEGATIVE
NITRITE UA: NEGATIVE
Protein, UA: NEGATIVE
RBC UA: NEGATIVE
Spec Grav, UA: 1.015
Urobilinogen, UA: NEGATIVE
pH, UA: 6

## 2013-07-07 NOTE — Progress Notes (Signed)
Pulse 103 Pt states that she is doing well.  EFW by Leopold's 3000 - 3300 g.

## 2013-07-09 ENCOUNTER — Inpatient Hospital Stay (HOSPITAL_COMMUNITY)
Admission: RE | Admit: 2013-07-09 | Discharge: 2013-07-12 | DRG: 774 | Disposition: A | Discharge status: Home / Self Care | Payer: Medicaid Other | Source: Ambulatory Visit | Attending: Obstetrics & Gynecology | Admitting: Obstetrics & Gynecology

## 2013-07-09 DIAGNOSIS — Z87891 Personal history of nicotine dependence: Secondary | ICD-10-CM

## 2013-07-09 DIAGNOSIS — Z3480 Encounter for supervision of other normal pregnancy, unspecified trimester: Secondary | ICD-10-CM

## 2013-07-09 DIAGNOSIS — R8271 Bacteriuria: Secondary | ICD-10-CM

## 2013-07-09 DIAGNOSIS — O10019 Pre-existing essential hypertension complicating pregnancy, unspecified trimester: Secondary | ICD-10-CM

## 2013-07-09 DIAGNOSIS — O283 Abnormal ultrasonic finding on antenatal screening of mother: Secondary | ICD-10-CM

## 2013-07-09 DIAGNOSIS — O1002 Pre-existing essential hypertension complicating childbirth: Principal | ICD-10-CM | POA: Diagnosis present

## 2013-07-09 DIAGNOSIS — O99214 Obesity complicating childbirth: Secondary | ICD-10-CM

## 2013-07-09 DIAGNOSIS — Z98891 History of uterine scar from previous surgery: Secondary | ICD-10-CM

## 2013-07-09 DIAGNOSIS — E669 Obesity, unspecified: Secondary | ICD-10-CM | POA: Diagnosis present

## 2013-07-09 DIAGNOSIS — O34219 Maternal care for unspecified type scar from previous cesarean delivery: Secondary | ICD-10-CM | POA: Diagnosis not present

## 2013-07-09 DIAGNOSIS — K219 Gastro-esophageal reflux disease without esophagitis: Secondary | ICD-10-CM | POA: Diagnosis present

## 2013-07-09 DIAGNOSIS — O9989 Other specified diseases and conditions complicating pregnancy, childbirth and the puerperium: Secondary | ICD-10-CM

## 2013-07-09 LAB — TYPE AND SCREEN
ABO/RH(D): A POS
Antibody Screen: NEGATIVE

## 2013-07-09 LAB — CBC
HCT: 32.8 % — ABNORMAL LOW (ref 36.0–46.0)
HEMOGLOBIN: 11.6 g/dL — AB (ref 12.0–15.0)
MCH: 31.5 pg (ref 26.0–34.0)
MCHC: 35.4 g/dL (ref 30.0–36.0)
MCV: 89.1 fL (ref 78.0–100.0)
Platelets: 245 10*3/uL (ref 150–400)
RBC: 3.68 MIL/uL — AB (ref 3.87–5.11)
RDW: 13.7 % (ref 11.5–15.5)
WBC: 12.9 10*3/uL — AB (ref 4.0–10.5)

## 2013-07-09 LAB — RPR: RPR: NONREACTIVE

## 2013-07-09 MED ORDER — OXYCODONE-ACETAMINOPHEN 5-325 MG PO TABS: 1.0000 | ORAL_TABLET | ORAL | Status: DC | PRN

## 2013-07-09 MED ORDER — NIFEDIPINE ER 30 MG PO TB24
30.0000 mg | ORAL_TABLET | Freq: Every day | ORAL | Status: DC
  Administered 2013-07-09 – 2013-07-11 (×3): 30 mg via ORAL
  Filled 2013-07-09 (×4): qty 1

## 2013-07-09 MED ORDER — CITRIC ACID-SODIUM CITRATE 334-500 MG/5ML PO SOLN: 30.0000 mL | ORAL | Status: DC | PRN

## 2013-07-09 MED ORDER — NIFEDIPINE ER 30 MG PO TB24
30.0000 mg | ORAL_TABLET | Freq: Every day | ORAL | Status: DC
  Filled 2013-07-09 (×2): qty 1

## 2013-07-09 MED ORDER — OXYTOCIN 40 UNITS IN LACTATED RINGERS INFUSION - SIMPLE MED: 62.5000 mL/h | INTRAVENOUS | Status: DC

## 2013-07-09 MED ORDER — BUTORPHANOL TARTRATE 1 MG/ML IJ SOLN
2.0000 mg | INTRAMUSCULAR | Status: DC | PRN
  Administered 2013-07-09 – 2013-07-10 (×3): 2 mg via INTRAVENOUS
  Filled 2013-07-09 (×4): qty 2

## 2013-07-09 MED ORDER — OXYTOCIN BOLUS FROM INFUSION
500.0000 mL | INTRAVENOUS | Status: DC
Start: 2013-07-09 — End: 2013-07-10

## 2013-07-09 MED ORDER — IBUPROFEN 600 MG PO TABS: 600.0000 mg | ORAL_TABLET | Freq: Four times a day (QID) | ORAL | Status: DC | PRN

## 2013-07-09 MED ORDER — TERBUTALINE SULFATE 1 MG/ML IJ SOLN: 0.2500 mg | Freq: Once | INTRAMUSCULAR | Status: AC | PRN

## 2013-07-09 MED ORDER — ONDANSETRON HCL 4 MG/2ML IJ SOLN: 4.0000 mg | Freq: Four times a day (QID) | INTRAMUSCULAR | Status: DC | PRN

## 2013-07-09 MED ORDER — LACTATED RINGERS IV SOLN
INTRAVENOUS | Status: DC
Start: 2013-07-09 — End: 2013-07-10
  Administered 2013-07-09 – 2013-07-10 (×4): via INTRAVENOUS

## 2013-07-09 MED ORDER — OXYTOCIN 40 UNITS IN LACTATED RINGERS INFUSION - SIMPLE MED
1.0000 m[IU]/min | INTRAVENOUS | Status: DC
Start: 2013-07-09 — End: 2013-07-10
  Administered 2013-07-09: 2 m[IU]/min via INTRAVENOUS
  Filled 2013-07-09: qty 1000

## 2013-07-09 MED ORDER — ACETAMINOPHEN 325 MG PO TABS: 650.0000 mg | ORAL_TABLET | ORAL | Status: DC | PRN

## 2013-07-09 MED ORDER — FLEET ENEMA 7-19 GM/118ML RE ENEM: 1.0000 | ENEMA | RECTAL | Status: DC | PRN

## 2013-07-09 MED ORDER — LACTATED RINGERS IV SOLN: 500.0000 mL | INTRAVENOUS | Status: DC | PRN

## 2013-07-09 MED ORDER — LIDOCAINE HCL (PF) 1 % IJ SOLN
30.0000 mL | INTRAMUSCULAR | Status: AC | PRN
  Administered 2013-07-10: 30 mL via SUBCUTANEOUS
  Filled 2013-07-09: qty 30

## 2013-07-09 NOTE — H&P (Signed)
Leslie Mills is a 28 y.o. female presenting for induction of labor. Maternal Medical History:  Reason for admission: The patient has a h/o chronic hypertension controlled on medications.  Fetal testing has been reassuring.  Fetal activity: Perceived fetal activity is normal.    Prenatal complications: PIH.   Prenatal Complications - Diabetes: none.    OB History   Grav Para Term Preterm Abortions TAB SAB Ect Mult Living   3 1 1  1 1    1      Past Medical History  Diagnosis Date  . Lactose intolerance   . Hypertension    Past Surgical History  Procedure Laterality Date  . Cesarean section     Family History: family history includes Diabetes in her father, maternal uncle, and paternal grandmother; Hypertension in her maternal grandmother and mother. Social History:  reports that she quit smoking about 7 months ago. She has never used smokeless tobacco. She reports that she does not drink alcohol or use illicit drugs.     Review of Systems  Constitutional: Negative for fever.  Eyes: Negative for blurred vision.  Respiratory: Negative for shortness of breath.   Gastrointestinal: Negative for vomiting.  Skin: Negative for rash.  Neurological: Negative for headaches.    Dilation: 1 Effacement (%): 50 Station: -2 Exam by:: J.Cox, RN Blood pressure 134/82, pulse 86, temperature 98.1 F (36.7 C), temperature source Oral, height 5\' 4"  (1.626 m), weight 220 lb (99.791 kg), last menstrual period 10/08/2012. Maternal Exam:  Abdomen: Patient reports no abdominal tenderness. Introitus: Normal vulva. Cervix: Cervix evaluated by sterile speculum exam and digital exam.     Fetal Exam Fetal Monitor Review: Variability: moderate (6-25 bpm).   Pattern: accelerations present and no decelerations.    Fetal State Assessment: Category I - tracings are normal.     Physical Exam  Constitutional: She appears well-developed.  HENT:  Head: Normocephalic.  Neck: Neck supple. No  thyromegaly present.  Cardiovascular: Normal rate and regular rhythm.   Respiratory: Breath sounds normal.  GI: Soft. Bowel sounds are normal.  Skin: No rash noted.   SPEC:  Cervix prepped w/betadine.  A Cooper balloon was placed--60 ml in uterine balloon, 30 ml in vaginal balloon. Prenatal labs: ABO, Rh: --/--/A POS (03/11 0750) Antibody: NEG (03/11 0750) Rubella: 2.53 (08/04 1345) RPR: NON REACTIVE (03/11 0750)  HBsAg: NEGATIVE (08/04 1345)  HIV: NON REACTIVE (12/04 1752)  GBS: NEGATIVE (02/11 1704)   Assessment/Plan: IUP @ 6078w1d.  Pregnancy complicated by chronic HTN.  H/O C/D--desires TOLAC Unfavorable Bishop score, Category I FHT  Admit  Mechanical cervical ripening for now   JACKSON-MOORE,Tatumn Corbridge A 07/09/2013, 1:30 PM

## 2013-07-10 ENCOUNTER — Inpatient Hospital Stay (HOSPITAL_COMMUNITY): Payer: Medicaid Other | Admitting: Anesthesiology

## 2013-07-10 ENCOUNTER — Encounter (HOSPITAL_COMMUNITY): Payer: Medicaid Other | Admitting: Anesthesiology

## 2013-07-10 ENCOUNTER — Encounter (HOSPITAL_COMMUNITY): Payer: Self-pay

## 2013-07-10 DIAGNOSIS — O34219 Maternal care for unspecified type scar from previous cesarean delivery: Secondary | ICD-10-CM | POA: Diagnosis not present

## 2013-07-10 LAB — CCBB MATERNAL DONOR DRAW

## 2013-07-10 MED ORDER — LANOLIN HYDROUS EX OINT: TOPICAL_OINTMENT | CUTANEOUS | Status: DC | PRN

## 2013-07-10 MED ORDER — DIBUCAINE 1 % RE OINT
1.0000 "application " | TOPICAL_OINTMENT | RECTAL | Status: DC | PRN
  Administered 2013-07-11: 1 via RECTAL
  Filled 2013-07-10: qty 28

## 2013-07-10 MED ORDER — OXYTOCIN 40 UNITS IN LACTATED RINGERS INFUSION - SIMPLE MED: 1.0000 m[IU]/min | INTRAVENOUS | Status: DC

## 2013-07-10 MED ORDER — SENNOSIDES-DOCUSATE SODIUM 8.6-50 MG PO TABS
2.0000 | ORAL_TABLET | ORAL | Status: DC
  Administered 2013-07-11 – 2013-07-12 (×2): 2 via ORAL
  Filled 2013-07-10 (×2): qty 2

## 2013-07-10 MED ORDER — ONDANSETRON HCL 4 MG PO TABS: 4.0000 mg | ORAL_TABLET | ORAL | Status: DC | PRN

## 2013-07-10 MED ORDER — EPHEDRINE 5 MG/ML INJ
10.0000 mg | INTRAVENOUS | Status: DC | PRN
  Filled 2013-07-10: qty 4
  Filled 2013-07-10: qty 2

## 2013-07-10 MED ORDER — ZOLPIDEM TARTRATE 5 MG PO TABS
5.0000 mg | ORAL_TABLET | Freq: Every evening | ORAL | Status: DC | PRN
Start: 2013-07-10 — End: 2013-07-12

## 2013-07-10 MED ORDER — TERBUTALINE SULFATE 1 MG/ML IJ SOLN: 0.2500 mg | Freq: Once | INTRAMUSCULAR | Status: AC | PRN

## 2013-07-10 MED ORDER — PRENATAL MULTIVITAMIN CH
1.0000 | ORAL_TABLET | Freq: Every day | ORAL | Status: DC
  Administered 2013-07-10 – 2013-07-11 (×2): 1 via ORAL
  Filled 2013-07-10 (×2): qty 1

## 2013-07-10 MED ORDER — LIDOCAINE HCL (PF) 1 % IJ SOLN
INTRAMUSCULAR | Status: DC | PRN
  Administered 2013-07-10 (×2): 4 mL

## 2013-07-10 MED ORDER — FENTANYL 2.5 MCG/ML BUPIVACAINE 1/10 % EPIDURAL INFUSION (WH - ANES)
INTRAMUSCULAR | Status: DC | PRN
  Administered 2013-07-10: 14 mL/h via EPIDURAL

## 2013-07-10 MED ORDER — DIPHENHYDRAMINE HCL 25 MG PO CAPS: 25.0000 mg | ORAL_CAPSULE | Freq: Four times a day (QID) | ORAL | Status: DC | PRN

## 2013-07-10 MED ORDER — MAGNESIUM HYDROXIDE 400 MG/5ML PO SUSP: 30.0000 mL | ORAL | Status: DC | PRN

## 2013-07-10 MED ORDER — OXYCODONE-ACETAMINOPHEN 5-325 MG PO TABS
1.0000 | ORAL_TABLET | ORAL | Status: DC | PRN
  Filled 2013-07-10: qty 1

## 2013-07-10 MED ORDER — MEASLES, MUMPS & RUBELLA VAC ~~LOC~~ INJ
0.5000 mL | INJECTION | Freq: Once | SUBCUTANEOUS | Status: DC
  Filled 2013-07-10: qty 0.5

## 2013-07-10 MED ORDER — ONDANSETRON HCL 4 MG/2ML IJ SOLN: 4.0000 mg | INTRAMUSCULAR | Status: DC | PRN

## 2013-07-10 MED ORDER — TETANUS-DIPHTH-ACELL PERTUSSIS 5-2.5-18.5 LF-MCG/0.5 IM SUSP: 0.5000 mL | Freq: Once | INTRAMUSCULAR | Status: DC

## 2013-07-10 MED ORDER — FENTANYL 2.5 MCG/ML BUPIVACAINE 1/10 % EPIDURAL INFUSION (WH - ANES)
14.0000 mL/h | INTRAMUSCULAR | Status: DC | PRN
  Filled 2013-07-10: qty 125

## 2013-07-10 MED ORDER — WITCH HAZEL-GLYCERIN EX PADS
1.0000 "application " | MEDICATED_PAD | CUTANEOUS | Status: DC | PRN
  Administered 2013-07-11: 1 via TOPICAL

## 2013-07-10 MED ORDER — LACTATED RINGERS IV SOLN
500.0000 mL | Freq: Once | INTRAVENOUS | Status: AC
  Administered 2013-07-10: 500 mL via INTRAVENOUS

## 2013-07-10 MED ORDER — IBUPROFEN 600 MG PO TABS
600.0000 mg | ORAL_TABLET | Freq: Four times a day (QID) | ORAL | Status: DC
  Administered 2013-07-10 – 2013-07-12 (×7): 600 mg via ORAL
  Filled 2013-07-10 (×7): qty 1

## 2013-07-10 MED ORDER — BENZOCAINE-MENTHOL 20-0.5 % EX AERO
1.0000 "application " | INHALATION_SPRAY | CUTANEOUS | Status: DC | PRN
  Administered 2013-07-10: 1 via TOPICAL
  Filled 2013-07-10: qty 56

## 2013-07-10 MED ORDER — EPHEDRINE 5 MG/ML INJ
10.0000 mg | INTRAVENOUS | Status: DC | PRN
  Filled 2013-07-10: qty 2

## 2013-07-10 MED ORDER — FERROUS SULFATE 325 (65 FE) MG PO TABS
325.0000 mg | ORAL_TABLET | Freq: Two times a day (BID) | ORAL | Status: DC
  Administered 2013-07-10 – 2013-07-11 (×3): 325 mg via ORAL
  Filled 2013-07-10 (×3): qty 1

## 2013-07-10 MED ORDER — PHENYLEPHRINE 40 MCG/ML (10ML) SYRINGE FOR IV PUSH (FOR BLOOD PRESSURE SUPPORT)
80.0000 ug | PREFILLED_SYRINGE | INTRAVENOUS | Status: DC | PRN
  Filled 2013-07-10: qty 2

## 2013-07-10 MED ORDER — DIPHENHYDRAMINE HCL 50 MG/ML IJ SOLN: 12.5000 mg | INTRAMUSCULAR | Status: DC | PRN

## 2013-07-10 MED ORDER — PHENYLEPHRINE 40 MCG/ML (10ML) SYRINGE FOR IV PUSH (FOR BLOOD PRESSURE SUPPORT)
80.0000 ug | PREFILLED_SYRINGE | INTRAVENOUS | Status: DC | PRN
  Filled 2013-07-10: qty 10
  Filled 2013-07-10: qty 2

## 2013-07-10 NOTE — Progress Notes (Signed)
UR chart review completed.  

## 2013-07-10 NOTE — Anesthesia Procedure Notes (Signed)
Epidural Patient location during procedure: OB Start time: 07/10/2013 4:01 AM  Staffing Anesthesiologist: Kemani Heidel A. Performed by: anesthesiologist   Preanesthetic Checklist Completed: patient identified, site marked, surgical consent, pre-op evaluation, timeout performed, IV checked, risks and benefits discussed and monitors and equipment checked  Epidural Patient position: sitting Prep: site prepped and draped and DuraPrep Patient monitoring: continuous pulse ox and blood pressure Approach: midline Location: L3-L4 Injection technique: LOR air  Needle:  Needle type: Tuohy  Needle gauge: 17 G Needle length: 9 cm and 9 Needle insertion depth: 6 cm Catheter type: closed end flexible Catheter size: 19 Gauge Catheter at skin depth: 11 cm Test dose: negative and Other  Assessment Events: blood not aspirated, injection not painful, no injection resistance, negative IV test and no paresthesia  Additional Notes Patient identified. Risks and benefits discussed including failed block, incomplete  Pain control, post dural puncture headache, nerve damage, paralysis, blood pressure Changes, nausea, vomiting, reactions to medications-both toxic and allergic and post Partum back pain. All questions were answered. Patient expressed understanding and wished to proceed. Sterile technique was used throughout procedure. Epidural site was Dressed with sterile barrier dressing. No paresthesias, signs of intravascular injection Or signs of intrathecal spread were encountered.  Patient was more comfortable after the epidural was dosed. Please see RN's note for documentation of vital signs and FHR which are stable.

## 2013-07-10 NOTE — Anesthesia Preprocedure Evaluation (Signed)
Anesthesia Evaluation  Patient identified by MRN, date of birth, ID band Patient awake    Reviewed: Allergy & Precautions, H&P , Patient's Chart, lab work & pertinent test results  Airway Mallampati: III TM Distance: >3 FB Neck ROM: Full    Dental no notable dental hx. (+) Teeth Intact   Pulmonary former smoker,  breath sounds clear to auscultation  Pulmonary exam normal       Cardiovascular hypertension, Rhythm:Regular Rate:Normal     Neuro/Psych negative neurological ROS  negative psych ROS   GI/Hepatic Neg liver ROS, GERD-  ,  Endo/Other  Morbid obesity  Renal/GU negative Renal ROS     Musculoskeletal negative musculoskeletal ROS (+)   Abdominal (+) + obese,   Peds  Hematology   Anesthesia Other Findings   Reproductive/Obstetrics (+) Pregnancy Previous C/Section                           Anesthesia Physical Anesthesia Plan  ASA: III  Anesthesia Plan: Epidural   Post-op Pain Management:    Induction:   Airway Management Planned: Natural Airway  Additional Equipment:   Intra-op Plan:   Post-operative Plan:   Informed Consent: I have reviewed the patients History and Physical, chart, labs and discussed the procedure including the risks, benefits and alternatives for the proposed anesthesia with the patient or authorized representative who has indicated his/her understanding and acceptance.     Plan Discussed with: Anesthesiologist  Anesthesia Plan Comments:         Anesthesia Quick Evaluation

## 2013-07-10 NOTE — Lactation Note (Signed)
This note was copied from the chart of Leslie Mills Hunley. Lactation Consultation Note  Patient Name: Leslie Mills Crutcher Today's Date: 07/10/2013 Reason for consult: Initial assessment of this second-time mom and her newborn at 8 hours of age.  Baby asleep in [place] and mom and 475 yo son are having dinner right now. Mom attempted to nurse her son 5 years ago and only tried a few days.  Mom reports that her newborn has been latching well and that she knows how to hand express her colostrum/milk.  Initial LATCH score after delivery was "10" and baby has nursed twice since birth.  LC discussed and encouraged STS and cue feedings.  LC encouraged review of Baby and Me pp 9, 14 and 20-25 for STS and BF information. LC provided [employer] [facility] brochure and [facility] and list of community and web site resources.    Maternal Data Formula Feeding for Exclusion: Yes Reason for exclusion: Mother's choice to formula and breast feed on admission Infant to breast within first hour of birth: Yes (initial LATCH score=10 and baby nursed for 15 minutes) Has patient been taught Hand Expression?: Yes Does the patient have breastfeeding experience prior to this delivery?: Yes  Feeding Feeding Type: Breast Fed Length of feed: 10 min  LATCH Score/Interventions Latch: Repeated attempts needed to sustain latch, nipple held in mouth throughout feeding, stimulation needed to elicit sucking reflex.  Audible Swallowing: A few with stimulation Intervention(s): Skin to skin  Type of Nipple: Everted at rest and after stimulation  Comfort (Breast/Nipple): Soft / non-tender     Hold (Positioning): Assistance needed to correctly position infant at breast and maintain latch. Intervention(s): Breastfeeding basics reviewed  LATCH Score: 7 (most recent feeding assessment by RN)  Lactation Tools Discussed/Used   STS, cue feedings, hand expression  Consult Status Consult Status: Follow-up Date:  07/11/13 Follow-up type: In-patient    Warrick ParisianBryant, Delmore Sear [facility] 07/10/2013, 7:17 PM

## 2013-07-10 NOTE — Anesthesia Postprocedure Evaluation (Signed)
Anesthesia Post Note  Patient: Leslie Mills  Procedure(s) Performed: * No procedures listed *  Anesthesia type: Epidural  Patient location: Mother/Baby  Post pain: Pain level controlled  Post assessment: Post-op Vital signs reviewed  Last Vitals:  Filed Vitals:   07/10/13 1400  BP: 139/78  Pulse: 97  Temp: 37.5 C  Resp: 18    Post vital signs: Reviewed  Level of consciousness:alert  Complications: No apparent anesthesia complications

## 2013-07-11 LAB — CBC
HCT: 30 % — ABNORMAL LOW (ref 36.0–46.0)
Hemoglobin: 10.5 g/dL — ABNORMAL LOW (ref 12.0–15.0)
MCH: 31.9 pg (ref 26.0–34.0)
MCHC: 35 g/dL (ref 30.0–36.0)
MCV: 91.2 fL (ref 78.0–100.0)
PLATELETS: 217 10*3/uL (ref 150–400)
RBC: 3.29 MIL/uL — ABNORMAL LOW (ref 3.87–5.11)
RDW: 13.8 % (ref 11.5–15.5)
WBC: 13 10*3/uL — ABNORMAL HIGH (ref 4.0–10.5)

## 2013-07-11 NOTE — Progress Notes (Signed)
Post Partum Day 1 Subjective: no complaints, up ad lib, voiding, tolerating PO, + flatus and Breast and Bottle feeding  Objective: Blood pressure 122/77, pulse 86, temperature 97.8 F (36.6 C), temperature source Oral, resp. rate 18, height 5\' 4"  (1.626 m), weight 220 lb (99.791 kg), last menstrual period 10/08/2012, SpO2 100.00%, unknown if currently breastfeeding.  Physical Exam:  General: alert and cooperative Lochia: appropriate Uterine Fundus: firm Incision: NA DVT Evaluation: No evidence of DVT seen on physical exam.   Recent Labs  07/09/13 0750 07/11/13 0540  HGB 11.6* 10.5*  HCT 32.8* 30.0*    Assessment/Plan: Plan for discharge tomorrow, Breastfeeding and Contraception BTL   LOS: 2 days   Leslie Mills 07/11/2013, 9:25 AM

## 2013-07-12 MED ORDER — IBUPROFEN 600 MG PO TABS: 600.0000 mg | ORAL_TABLET | Freq: Four times a day (QID) | ORAL | Status: DC | PRN

## 2013-07-12 MED ORDER — OXYCODONE-ACETAMINOPHEN 5-325 MG PO TABS: 1.0000 | ORAL_TABLET | ORAL | Status: DC | PRN

## 2013-07-12 NOTE — Progress Notes (Signed)
Post Partum Day 2 Subjective: no complaints  Objective: Blood pressure 128/79, pulse 98, temperature 98.1 F (36.7 C), temperature source Oral, resp. rate 19, height 5\' 4"  (1.626 m), weight 220 lb (99.791 kg), last menstrual period 10/08/2012, SpO2 100.00%, unknown if currently breastfeeding.  Physical Exam:  General: alert and no distress Lochia: appropriate Uterine Fundus: firm Incision: none DVT Evaluation: No evidence of DVT seen on physical exam.   Recent Labs  07/09/13 0750 07/11/13 0540  HGB 11.6* 10.5*  HCT 32.8* 30.0*    Assessment/Plan: Discharge home   LOS: 3 days   HARPER,CHARLES A 07/12/2013, 5:55 AM

## 2013-07-12 NOTE — Discharge Instructions (Signed)
Breastfeeding Deciding to breastfeed is one of the best choices you can make for you and your baby. A change in hormones during pregnancy causes your breast tissue to grow and increases the number and size of your milk ducts. These hormones also allow proteins, sugars, and fats from your blood supply to make breast milk in your milk-producing glands. Hormones prevent breast milk from being released before your baby is born as well as prompt milk flow after birth. Once breastfeeding has begun, thoughts of your baby, as well as his or her sucking or crying, can stimulate the release of milk from your milk-producing glands.  BENEFITS OF BREASTFEEDING For Your Baby  Your first milk (colostrum) helps your baby's digestive system function better.   There are antibodies in your milk that help your baby fight off infections.   Your baby has a lower incidence of asthma, allergies, and sudden infant death syndrome.   The nutrients in breast milk are better for your baby than infant formulas and are designed uniquely for your baby's needs.   Breast milk improves your baby's brain development.   Your baby is less likely to develop other conditions, such as childhood obesity, asthma, or type 2 diabetes mellitus.  For You   Breastfeeding helps to create a very special bond between you and your baby.   Breastfeeding is convenient. Breast milk is always available at the correct temperature and costs nothing.   Breastfeeding helps to burn calories and helps you lose the weight gained during pregnancy.   Breastfeeding makes your uterus contract to its prepregnancy size faster and slows bleeding (lochia) after you give birth.   Breastfeeding helps to lower your risk of developing type 2 diabetes mellitus, osteoporosis, and breast or ovarian cancer later in life. SIGNS THAT YOUR BABY IS HUNGRY Early Signs of Hunger  Increased alertness or activity.  Stretching.  Movement of the head from  side to side.  Movement of the head and opening of the mouth when the corner of the mouth or cheek is stroked (rooting).  Increased sucking sounds, smacking lips, cooing, sighing, or squeaking.  Hand-to-mouth movements.  Increased sucking of fingers or hands. Late Signs of Hunger  Fussing.  Intermittent crying. Extreme Signs of Hunger Signs of extreme hunger will require calming and consoling before your baby will be able to breastfeed successfully. Do not wait for the following signs of extreme hunger to occur before you initiate breastfeeding:   Restlessness.  A loud, strong cry.   Screaming. BREASTFEEDING BASICS Breastfeeding Initiation  Find a comfortable place to sit or lie down, with your neck and back well supported.  Place a pillow or rolled up blanket under your baby to bring him or her to the level of your breast (if you are seated). Nursing pillows are specially designed to help support your arms and your baby while you breastfeed.  Make sure that your baby's abdomen is facing your abdomen.   Gently massage your breast. With your fingertips, massage from your chest wall toward your nipple in a circular motion. This encourages milk flow. You may need to continue this action during the feeding if your milk flows slowly.  Support your breast with 4 fingers underneath and your thumb above your nipple. Make sure your fingers are well away from your nipple and your baby's mouth.   Stroke your baby's lips gently with your finger or nipple.   When your baby's mouth is open wide enough, quickly bring your baby to your   breast, placing your entire nipple and as much of the colored area around your nipple (areola) as possible into your baby's mouth.   More areola should be visible above your baby's upper lip than below the lower lip.   Your baby's tongue should be between his or her lower gum and your breast.   Ensure that your baby's mouth is correctly positioned  around your nipple (latched). Your baby's lips should create a seal on your breast and be turned out (everted).  It is common for your baby to suck about 2 3 minutes in order to start the flow of breast milk. Latching Teaching your baby how to latch on to your breast properly is very important. An improper latch can cause nipple pain and decreased milk supply for you and poor weight gain in your baby. Also, if your baby is not latched onto your nipple properly, he or she may swallow some air during feeding. This can make your baby fussy. Burping your baby when you switch breasts during the feeding can help to get rid of the air. However, teaching your baby to latch on properly is still the best way to prevent fussiness from swallowing air while breastfeeding. Signs that your baby has successfully latched on to your nipple:    Silent tugging or silent sucking, without causing you pain.   Swallowing heard between every 3 4 sucks.    Muscle movement above and in front of his or her ears while sucking.  Signs that your baby has not successfully latched on to nipple:   Sucking sounds or smacking sounds from your baby while breastfeeding.  Nipple pain. If you think your baby has not latched on correctly, slip your finger into the corner of your baby's mouth to break the suction and place it between your baby's gums. Attempt breastfeeding initiation again. Signs of Successful Breastfeeding Signs from your baby:   A gradual decrease in the number of sucks or complete cessation of sucking.   Falling asleep.   Relaxation of his or her body.   Retention of a small amount of milk in his or her mouth.   Letting go of your breast by himself or herself. Signs from you:  Breasts that have increased in firmness, weight, and size 1 3 hours after feeding.   Breasts that are softer immediately after breastfeeding.  Increased milk volume, as well as a change in milk consistency and color by  the 5th day of breastfeeding.   Nipples that are not sore, cracked, or bleeding. Signs That Your Baby is Getting Enough Milk  Wetting at least 3 diapers in a 24-hour period. The urine should be clear and pale yellow by age 5 days.  At least 3 stools in a 24-hour period by age 5 days. The stool should be soft and yellow.  At least 3 stools in a 24-hour period by age 7 days. The stool should be seedy and yellow.  No loss of weight greater than 10% of birth weight during the first 3 days of age.  Average weight gain of 4 7 ounces (120 210 mL) per week after age 4 days.  Consistent daily weight gain by age 5 days, without weight loss after the age of 2 weeks. After a feeding, your baby may spit up a small amount. This is common. BREASTFEEDING FREQUENCY AND DURATION Frequent feeding will help you make more milk and can prevent sore nipples and breast engorgement. Breastfeed when you feel the need to reduce   the fullness of your breasts or when your baby shows signs of hunger. This is called "breastfeeding on demand." Avoid introducing a pacifier to your baby while you are working to establish breastfeeding (the first 4 6 weeks after your baby is born). After this time you may choose to use a pacifier. Research has shown that pacifier use during the first year of a baby's life decreases the risk of sudden infant death syndrome (SIDS). Allow your baby to feed on each breast as long as he or she wants. Breastfeed until your baby is finished feeding. When your baby unlatches or falls asleep while feeding from the first breast, offer the second breast. Because newborns are often sleepy in the first few weeks of life, you may need to awaken your baby to get him or her to feed. Breastfeeding times will vary from baby to baby. However, the following rules can serve as a guide to help you ensure that your baby is properly fed:  Newborns (babies 4 weeks of age or younger) may breastfeed every 1 3  hours.  Newborns should not go longer than 3 hours during the day or 5 hours during the night without breastfeeding.  You should breastfeed your baby a minimum of 8 times in a 24-hour period until you begin to introduce solid foods to your baby at around 6 months of age. BREAST MILK PUMPING Pumping and storing breast milk allows you to ensure that your baby is exclusively fed your breast milk, even at times when you are unable to breastfeed. This is especially important if you are going back to work while you are still breastfeeding or when you are not able to be present during feedings. Your lactation consultant can give you guidelines on how long it is safe to store breast milk.  A breast pump is a machine that allows you to pump milk from your breast into a sterile bottle. The pumped breast milk can then be stored in a refrigerator or freezer. Some breast pumps are operated by hand, while others use electricity. Ask your lactation consultant which type will work best for you. Breast pumps can be purchased, but some hospitals and breastfeeding support groups lease breast pumps on a monthly basis. A lactation consultant can teach you how to hand express breast milk, if you prefer not to use a pump.  CARING FOR YOUR BREASTS WHILE YOU BREASTFEED Nipples can become dry, cracked, and sore while breastfeeding. The following recommendations can help keep your breasts moisturized and healthy:  Avoid using soap on your nipples.   Wear a supportive bra. Although not required, special nursing bras and tank tops are designed to allow access to your breasts for breastfeeding without taking off your entire bra or top. Avoid wearing underwire style bras or extremely tight bras.  Air dry your nipples for 3 4minutes after each feeding.   Use only cotton bra pads to absorb leaked breast milk. Leaking of breast milk between feedings is normal.   Use lanolin on your nipples after breastfeeding. Lanolin helps to  maintain your skin's normal moisture barrier. If you use pure lanolin you do not need to wash it off before feeding your baby again. Pure lanolin is not toxic to your baby. You may also hand express a few drops of breast milk and gently massage that milk into your nipples and allow the milk to air dry. In the first few weeks after giving birth, some women experience extremely full breasts (engorgement). Engorgement can make   your breasts feel heavy, warm, and tender to the touch. Engorgement peaks within 3 5 days after you give birth. The following recommendations can help ease engorgement:  Completely empty your breasts while breastfeeding or pumping. You may want to start by applying warm, moist heat (in the shower or with warm water-soaked hand towels) just before feeding or pumping. This increases circulation and helps the milk flow. If your baby does not completely empty your breasts while breastfeeding, pump any extra milk after he or she is finished.  Wear a snug bra (nursing or regular) or tank top for 1 2 days to signal your body to slightly decrease milk production.  Apply ice packs to your breasts, unless this is too uncomfortable for you.  Make sure that your baby is latched on and positioned properly while breastfeeding. If engorgement persists after 48 hours of following these recommendations, contact your health care provider or a lactation consultant. OVERALL HEALTH CARE RECOMMENDATIONS WHILE BREASTFEEDING  Eat healthy foods. Alternate between meals and snacks, eating 3 of each per day. Because what you eat affects your breast milk, some of the foods may make your baby more irritable than usual. Avoid eating these foods if you are sure that they are negatively affecting your baby.  Drink milk, fruit juice, and water to satisfy your thirst (about 10 glasses a day).   Rest often, relax, and continue to take your prenatal vitamins to prevent fatigue, stress, and anemia.  Continue  breast self-awareness checks.  Avoid chewing and smoking tobacco.  Avoid alcohol and drug use. Some medicines that may be harmful to your baby can pass through breast milk. It is important to ask your health care provider before taking any medicine, including all over-the-counter and prescription medicine as well as vitamin and herbal supplements. It is possible to become pregnant while breastfeeding. If birth control is desired, ask your health care provider about options that will be safe for your baby. SEEK MEDICAL CARE IF:   You feel like you want to stop breastfeeding or have become frustrated with breastfeeding.  You have painful breasts or nipples.  Your nipples are cracked or bleeding.  Your breasts are red, tender, or warm.  You have a swollen area on either breast.  You have a fever or chills.  You have nausea or vomiting.  You have drainage other than breast milk from your nipples.  Your breasts do not become full before feedings by the 5th day after you give birth.  You feel sad and depressed.  Your baby is too sleepy to eat well.  Your baby is having trouble sleeping.   Your baby is wetting less than 3 diapers in a 24-hour period.  Your baby has less than 3 stools in a 24-hour period.  Your baby's skin or the white part of his or her eyes becomes yellow.   Your baby is not gaining weight by 5 days of age. SEEK IMMEDIATE MEDICAL CARE IF:   Your baby is overly tired (lethargic) and does not want to wake up and feed.  Your baby develops an unexplained fever. Document Released: 04/17/2005 Document Revised: 12/18/2012 Document Reviewed: 10/09/2012 [employer] Patient Information 2014 [employer].  

## 2013-07-12 NOTE — Lactation Note (Signed)
This note was copied from the chart of Leslie Lorelei PontSharee Schryver. Lactation Consultation Note; Follow up visit with this mom before DC. She has been mostly bottle feeding formula. Reports that she is going to try to get a pump and pump and bottle feed EBM. Has manual pump at present. Reports that her breasts are not feeling any heavier yet. No questions at present To call prn  Patient Name: Leslie Mills OZHYQ'MToday's Date: 07/12/2013 Reason for consult: Follow-up assessment   Maternal Data    Feeding Feeding Type: Bottle Fed - Formula  LATCH Score/Interventions                      Lactation Tools Discussed/Used     Consult Status Consult Status: Complete    Pamelia HoitWeeks, Eden Toohey D 07/12/2013, 9:14 AM

## 2013-07-12 NOTE — Discharge Summary (Signed)
Obstetric Discharge Summary Reason for Admission: onset of labor Prenatal Procedures: ultrasound Intrapartum Procedures: spontaneous vaginal delivery Postpartum Procedures: none Complications-Operative and Postpartum: none Hemoglobin  Date Value Ref Range Status  07/11/2013 10.5* 12.0 - 15.0 g/dL Final     HCT  Date Value Ref Range Status  07/11/2013 30.0* 36.0 - 46.0 % Final    Physical Exam:  General: alert and no distress Lochia: appropriate Uterine Fundus: firm Incision: none DVT Evaluation: No evidence of DVT seen on physical exam.  Discharge Diagnoses: Term Pregnancy-delivered  Discharge Information: Date: 07/12/2013 Activity: pelvic rest Diet: routine Medications: PNV, Ibuprofen, Colace and Percocet Condition: stable Instructions: refer to practice specific booklet Discharge to: home Follow-up Information   Follow up with Antionette CharJACKSON-MOORE,LISA A, MD. Schedule an appointment as soon as possible for a visit in 2 weeks.   Specialty:  Obstetrics and Gynecology   Contact information:   [address] [place] [id number] [phone]       Newborn Data: Live born female  Birth Weight: 5 lb 13.3 oz (2645 g) APGAR: 9, 9  Home with mother.  Guerry Covington A 07/12/2013, 6:01 AM

## 2013-07-13 ENCOUNTER — Inpatient Hospital Stay (HOSPITAL_COMMUNITY): Admission: RE | Admit: 2013-07-13 | Payer: Medicaid Other | Source: Ambulatory Visit

## 2013-07-15 ENCOUNTER — Inpatient Hospital Stay (HOSPITAL_COMMUNITY): Admission: AD | Admit: 2013-07-15 | Payer: Medicaid Other | Source: Ambulatory Visit | Admitting: Obstetrics

## 2013-07-28 ENCOUNTER — Ambulatory Visit (INDEPENDENT_AMBULATORY_CARE_PROVIDER_SITE_OTHER): Payer: Medicaid Other | Admitting: Obstetrics & Gynecology

## 2013-07-28 ENCOUNTER — Encounter: Payer: Self-pay | Admitting: Obstetrics & Gynecology

## 2013-07-28 NOTE — Progress Notes (Signed)
Subjective:     Leslie Mills is a 28 y.o. female who presents for a postpartum visit. She is 2 weeks postpartum following a spontaneous vaginal delivery. I have fully reviewed the prenatal and intrapartum course. The delivery was at 39 gestational weeks. Outcome: spontaneous vaginal delivery. Anesthesia: epidural. Postpartum course has been normal. Baby's course has been normal. Baby is feeding by bottle Rush Barer- Gerber. Bleeding staining only. Bowel function is normal. Bladder function is normal. Patient is not sexually active. Contraception method is abstinence. Patient wants to get her tubes tied- wants OCP until she can schedule. Postpartum depression screening: negative.  The following portions of the patient's history were reviewed and updated as appropriate: allergies, current medications, past family history, past medical history, past social history, past surgical history and problem list.  Review of Systems Pertinent items are noted in HPI.   Objective:    BP 164/105  Pulse 54  Temp(Src) 98.7 F (37.1 C)  Ht 5\' 3"  (1.6 m)  Wt 205 lb 12.8 oz (93.35 kg)  BMI 36.46 kg/m2  Breastfeeding? No       No exam today Assessment:   Elevated B/P  Plan:    1. Contraception: tubal ligation planned 2. COCP in the interim 3. Follow up as needed 4.  Antihypertensive medication/f/u w/primary care provider

## 2013-07-31 ENCOUNTER — Encounter: Payer: Self-pay | Admitting: Obstetrics & Gynecology

## 2013-07-31 NOTE — Patient Instructions (Signed)
Sterilization Information, Female Female sterilization is a procedure to permanently prevent pregnancy. There are different ways to perform sterilization, but all either block or close the fallopian tubes so that your eggs cannot reach your uterus. If your egg cannot reach your uterus, sperm cannot fertilize the egg, and you cannot get pregnant.  Sterilization is performed by a surgical procedure. Sometimes these procedures are performed in a hospital while a patient is asleep. Sometimes they can be done in a clinic setting with the patient awake. The fallopian tubes can be surgically cut, tied, or sealed through a procedure called tubal ligation. The fallopian tubes can also be closed with clips or rings. Sterilization can also be done by placing a tiny coil into each fallopian tube, which causes scar tissue to grow inside the tube. The scar tissue then blocks the tubes.  Discuss sterilization with your caregiver to answer any concerns you or your partner may have. You may want to ask what type of sterilization your caregiver performs. Some caregivers may not perform all the various options. Sterilization is permanent and should only be done if you are sure you do not want children or do not want any more children. Having a sterilization reversed may not be successful.  STERILIZATION PROCEDURES  Laparoscopic sterilization. This is a surgical method performed at a time other than right after childbirth. Two incisions are made in the lower abdomen. A thin, lighted tube (laparoscope) is inserted into one of the incisions and is used to perform the procedure. The fallopian tubes are closed with a ring or a clip. An instrument that uses heat could be used to seal the tubes closed (electrocautery).   Mini-laparotomy. This is a surgical method done 1 or 2 days after giving birth. Typically, a small incision is made just below the belly button (umbilicus) and the fallopian tubes are exposed. The tubes can then be  sealed, tied, or cut.   Hysteroscopic sterilization. This is performed at a time other than right after childbirth. A tiny, spring-like coil is inserted through the cervix and uterus and placed into the fallopian tubes. The coil causes scaring and blocks the tubes. Other forms of contraception should be used for 3 months after the procedure to allow the scar tissue to form completely. Additionally, it is required hysterosalpingography be done 3 months later to ensure that the procedure was successful. Hysterosalpingography is a procedure that uses X-rays to look at your uterus and fallopian tubes after a material to make them show up better has been inserted. IS STERILIZATION SAFE? Sterilization is considered safe with very rare complications. Risks depend on the type of procedure you have. As with any surgical procedure, there are risks. Some risks of sterilization by any means include:   Bleeding.  Infection.  Reaction to anesthesia medicine.  Injury to surrounding organs. Risks specific to having hysteroscopic coils placed include:  The coils may not be placed correctly the first time.   The coils may move out of place.   The tubes may not get completely blocked after 3 months.   Injury to surrounding organs when placing the coil.  HOW EFFECTIVE IS FEMALE STERILIZATION? Sterilization is nearly 100% effective, but it can fail. Depending on the type of sterilization, the rate of failure can be as high as 3%. After hysteroscopic sterilization with placement of fallopian tube coils, you will need back-up birth control for 3 months after the procedure. Sterilization is effective for a lifetime.  BENEFITS OF STERILIZATION  It does   not affect your hormones, and therefore will not affect your menstrual periods, sexual desire, or performance.   It is effective for a lifetime.   It is safe.   You do not need to worry about getting pregnant. Keep in mind that if you had the  hysteroscopic placement procedure, you must wait 3 months after the procedure (or until your caregiver confirms) before pregnancy is not considered possible.   There are no side effects unlike other types of birth control (contraception).  DRAWBACKS OF STERILIZATION  You must be sure you do not want children or any more children. The procedure is permanent.   It does not provide protection against sexually transmitted infections (STIs).   The tubes can grow back together. If this happens, there is a risk of pregnancy. There is also an increased risk (50%) of pregnancy being an ectopic pregnancy. This is a pregnancy that happens outside of the uterus. Document Released: 10/04/2007 Document Revised: 10/17/2011 Document Reviewed: 08/03/2011 [employer] Patient Information 2014 [employer].  

## 2013-08-06 ENCOUNTER — Other Ambulatory Visit: Payer: Self-pay | Admitting: *Deleted

## 2013-08-06 ENCOUNTER — Encounter: Payer: Self-pay | Admitting: Obstetrics & Gynecology

## 2013-08-25 ENCOUNTER — Ambulatory Visit (INDEPENDENT_AMBULATORY_CARE_PROVIDER_SITE_OTHER): Payer: Medicaid Other | Admitting: Obstetrics & Gynecology

## 2013-08-25 ENCOUNTER — Encounter (HOSPITAL_COMMUNITY): Payer: Self-pay | Admitting: Pharmacist

## 2013-08-25 ENCOUNTER — Encounter: Payer: Self-pay | Admitting: Obstetrics & Gynecology

## 2013-08-25 VITALS — BP 141/100 | HR 85 | Temp 98.3°F | Ht 63.0 in | Wt 204.0 lb

## 2013-08-25 DIAGNOSIS — Z124 Encounter for screening for malignant neoplasm of cervix: Secondary | ICD-10-CM

## 2013-08-25 MED ORDER — NORETHINDRONE 0.35 MG PO TABS: 1.0000 | ORAL_TABLET | Freq: Every day | ORAL | Status: DC

## 2013-08-25 NOTE — Addendum Note (Signed)
Addended by: Elby BeckPAUL, Ineta Sinning F on: 08/25/2013 01:46 PM   Modules accepted: Orders

## 2013-08-25 NOTE — Progress Notes (Signed)
Subjective:     Leslie Mills is a 28 y.o. female who presents for a postpartum visit. She is 6 weeks postpartum following a VBAC. I have fully reviewed the prenatal and intrapartum course. The delivery was at term. Outcome: vaginal birth after cesarean (VBAC). Anesthesia: epidural. Postpartum course has been uncomplicated. Baby's course has been uncomplicated. Baby is feeding by bottle. Bleeding no bleeding. Bowel function is normal. Bladder function is normal. Patient is sexually active. Contraception method is condoms. Postpartum depression screening: negative.  The following portions of the patient's history were reviewed and updated as appropriate: allergies, current medications, past family history, past medical history, past social history, past surgical history and problem list.  Review of Systems Pertinent items are noted in HPI.   Objective:    BP 141/100  Pulse 85  Temp(Src) 98.3 F (36.8 C)  Ht 5\' 3"  (1.6 m)  Wt 92.534 kg (204 lb)  BMI 36.15 kg/m2  LMP 08/15/2013  Breastfeeding? No        General:   alert  Skin:   no rash or abnormalities  Lungs:   clear to auscultation bilaterally  Heart:   regular rate and rhythm, S1, S2 normal, no murmur, click, rub or gallop  Breasts:   normal without suspicious masses, skin or nipple changes or axillary nodes  Abdomen:  normal findings: no organomegaly, soft, non-tender and no hernia  Pelvis:  External genitalia: normal general appearance Urinary system: urethral meatus normal and bladder without fullness, nontender Vaginal: normal without tenderness, induration or masses Cervix: normal appearance Adnexa: normal bimanual exam Uterus: anteverted and non-tender, normal size     Assessment:     Normal postpartum exam. Pap smear done at today's visit.   Plan:    1. Contraception: condoms/minipill 2. Follow-up w/primary care provider re: chronic HTN 3. Follow up postop

## 2013-08-26 LAB — PAP IG, CT-NG, RFX HPV ASCU
Chlamydia Probe Amp: NEGATIVE
GC Probe Amp: NEGATIVE

## 2013-09-02 NOTE — Patient Instructions (Addendum)
   Your procedure is scheduled on:  Friday, May 8  [facility]: 8 AM Pick up the phone at the desk and dial [phone] and inform us of your arrival.  Please call this number if you have any problems the morning of surgery: [phone]  Remember: Do not eat food after midnight: Thursday Take these medicines the morning of surgery with a SIP OF WATER:  PROCARDIA   Do not wear jewelry, make-up, or FINGER nail polish No metal in your hair or on your body. Do not wear lotions, powders, perfumes.  You may wear deodorant.  Do not bring valuables to the hospital. Contacts, dentures or bridgework may not be worn into surgery.  Patients discharged on the day of surgery will not be allowed to drive home.  Home with father Mr. Wynetta Emeryarl Sylva.

## 2013-09-03 ENCOUNTER — Encounter (HOSPITAL_COMMUNITY): Payer: Self-pay

## 2013-09-03 ENCOUNTER — Encounter (HOSPITAL_COMMUNITY)
Admission: RE | Admit: 2013-09-03 | Discharge: 2013-09-03 | Disposition: A | Discharge status: Home / Self Care | Payer: Medicaid Other | Source: Ambulatory Visit | Attending: Obstetrics & Gynecology | Admitting: Obstetrics & Gynecology

## 2013-09-03 HISTORY — DX: Reserved for concepts with insufficient information to code with codable children: IMO0002

## 2013-09-03 LAB — BASIC METABOLIC PANEL
BUN: 10 mg/dL (ref 6–23)
CHLORIDE: 101 meq/L (ref 96–112)
CO2: 22 mEq/L (ref 19–32)
Calcium: 9.4 mg/dL (ref 8.4–10.5)
Creatinine, Ser: 0.68 mg/dL (ref 0.50–1.10)
GFR calc Af Amer: >90 mL/min (ref >90)
GFR calc non Af Amer: >90 mL/min (ref >90)
Glucose, Bld: 92 mg/dL (ref 70–99)
POTASSIUM: 4.1 meq/L (ref 3.7–5.3)
Sodium: 135 mEq/L — ABNORMAL LOW (ref 137–147)

## 2013-09-03 LAB — CBC
HEMATOCRIT: 39.2 % (ref 36.0–46.0)
Hemoglobin: 13.8 g/dL (ref 12.0–15.0)
MCH: 31.9 pg (ref 26.0–34.0)
MCHC: 35.2 g/dL (ref 30.0–36.0)
MCV: 90.5 fL (ref 78.0–100.0)
Platelets: 301 10*3/uL (ref 150–400)
RBC: 4.33 MIL/uL (ref 3.87–5.11)
RDW: 13.3 % (ref 11.5–15.5)
WBC: 7.9 10*3/uL (ref 4.0–10.5)

## 2013-09-05 ENCOUNTER — Ambulatory Visit (HOSPITAL_COMMUNITY): Payer: Medicaid Other | Admitting: Anesthesiology

## 2013-09-05 ENCOUNTER — Encounter (HOSPITAL_COMMUNITY)
Admission: RE | Disposition: A | Discharge status: Home / Self Care | Payer: Self-pay | Source: Ambulatory Visit | Attending: Obstetrics & Gynecology

## 2013-09-05 ENCOUNTER — Ambulatory Visit (HOSPITAL_COMMUNITY)
Admission: RE | Admit: 2013-09-05 | Discharge: 2013-09-05 | Disposition: A | Discharge status: Home / Self Care | Payer: Medicaid Other | Source: Ambulatory Visit | Attending: Obstetrics & Gynecology | Admitting: Obstetrics & Gynecology

## 2013-09-05 ENCOUNTER — Encounter (HOSPITAL_COMMUNITY): Payer: Medicaid Other | Admitting: Anesthesiology

## 2013-09-05 DIAGNOSIS — I10 Essential (primary) hypertension: Secondary | ICD-10-CM | POA: Insufficient documentation

## 2013-09-05 DIAGNOSIS — Z8249 Family history of ischemic heart disease and other diseases of the circulatory system: Secondary | ICD-10-CM | POA: Insufficient documentation

## 2013-09-05 DIAGNOSIS — Z87891 Personal history of nicotine dependence: Secondary | ICD-10-CM | POA: Insufficient documentation

## 2013-09-05 DIAGNOSIS — Z302 Encounter for sterilization: Secondary | ICD-10-CM

## 2013-09-05 DIAGNOSIS — Z641 Problems related to multiparity: Secondary | ICD-10-CM | POA: Insufficient documentation

## 2013-09-05 HISTORY — PX: LAPAROSCOPIC BILATERAL SALPINGECTOMY: SHX5889

## 2013-09-05 LAB — PREGNANCY, URINE: Preg Test, Ur: NEGATIVE

## 2013-09-05 SURGERY — SALPINGECTOMY, BILATERAL, LAPAROSCOPIC
Anesthesia: General | Site: Abdomen | Laterality: Bilateral

## 2013-09-05 MED ORDER — DEXAMETHASONE SODIUM PHOSPHATE 10 MG/ML IJ SOLN
INTRAMUSCULAR | Status: DC | PRN
  Administered 2013-09-05: 10 mg via INTRAVENOUS

## 2013-09-05 MED ORDER — PROPOFOL 10 MG/ML IV EMUL
INTRAVENOUS | Status: AC
  Filled 2013-09-05: qty 20

## 2013-09-05 MED ORDER — MIDAZOLAM HCL 2 MG/2ML IJ SOLN
INTRAMUSCULAR | Status: AC
  Filled 2013-09-05: qty 2

## 2013-09-05 MED ORDER — DEXAMETHASONE SODIUM PHOSPHATE 10 MG/ML IJ SOLN
INTRAMUSCULAR | Status: AC
  Filled 2013-09-05: qty 1

## 2013-09-05 MED ORDER — LIDOCAINE HCL (CARDIAC) 20 MG/ML IV SOLN
INTRAVENOUS | Status: AC
  Filled 2013-09-05: qty 5

## 2013-09-05 MED ORDER — METHYLENE BLUE 1 % INJ SOLN
INTRAMUSCULAR | Status: AC
  Filled 2013-09-05: qty 10

## 2013-09-05 MED ORDER — KETOROLAC TROMETHAMINE 30 MG/ML IJ SOLN
INTRAMUSCULAR | Status: AC
  Filled 2013-09-05: qty 1

## 2013-09-05 MED ORDER — ONDANSETRON HCL 4 MG/2ML IJ SOLN
INTRAMUSCULAR | Status: DC | PRN
  Administered 2013-09-05: 4 mg via INTRAVENOUS

## 2013-09-05 MED ORDER — ROCURONIUM BROMIDE 100 MG/10ML IV SOLN
INTRAVENOUS | Status: DC | PRN
  Administered 2013-09-05: 30 mg via INTRAVENOUS

## 2013-09-05 MED ORDER — BUPIVACAINE HCL (PF) 0.25 % IJ SOLN
INTRAMUSCULAR | Status: AC
  Filled 2013-09-05: qty 30

## 2013-09-05 MED ORDER — OXYCODONE-ACETAMINOPHEN 5-325 MG PO TABS: 2.0000 | ORAL_TABLET | Freq: Four times a day (QID) | ORAL | Status: DC | PRN

## 2013-09-05 MED ORDER — KETOROLAC TROMETHAMINE 30 MG/ML IJ SOLN
INTRAMUSCULAR | Status: DC | PRN
  Administered 2013-09-05: 30 mg via INTRAVENOUS

## 2013-09-05 MED ORDER — MIDAZOLAM HCL 2 MG/2ML IJ SOLN
INTRAMUSCULAR | Status: DC | PRN
  Administered 2013-09-05: 2 mg via INTRAVENOUS

## 2013-09-05 MED ORDER — GLYCOPYRROLATE 0.2 MG/ML IJ SOLN
INTRAMUSCULAR | Status: DC | PRN
  Administered 2013-09-05: 0.6 mg via INTRAVENOUS

## 2013-09-05 MED ORDER — ONDANSETRON HCL 4 MG/2ML IJ SOLN
INTRAMUSCULAR | Status: AC
  Filled 2013-09-05: qty 2

## 2013-09-05 MED ORDER — FENTANYL CITRATE 0.05 MG/ML IJ SOLN
INTRAMUSCULAR | Status: DC | PRN
  Administered 2013-09-05 (×2): 50 ug via INTRAVENOUS
  Administered 2013-09-05 (×2): 100 ug via INTRAVENOUS

## 2013-09-05 MED ORDER — FENTANYL CITRATE 0.05 MG/ML IJ SOLN
INTRAMUSCULAR | Status: AC
  Filled 2013-09-05: qty 2

## 2013-09-05 MED ORDER — HEPARIN SODIUM (PORCINE) 5000 UNIT/ML IJ SOLN
INTRAMUSCULAR | Status: AC
  Filled 2013-09-05: qty 1

## 2013-09-05 MED ORDER — FENTANYL CITRATE 0.05 MG/ML IJ SOLN
INTRAMUSCULAR | Status: AC
  Filled 2013-09-05: qty 5

## 2013-09-05 MED ORDER — PROPOFOL 10 MG/ML IV BOLUS
INTRAVENOUS | Status: DC | PRN
  Administered 2013-09-05: 200 mg via INTRAVENOUS

## 2013-09-05 MED ORDER — BUPIVACAINE HCL (PF) 0.25 % IJ SOLN
INTRAMUSCULAR | Status: DC | PRN
  Administered 2013-09-05: 4 mL

## 2013-09-05 MED ORDER — NEOSTIGMINE METHYLSULFATE 10 MG/10ML IV SOLN
INTRAVENOUS | Status: DC | PRN
  Administered 2013-09-05: 4 mg via INTRAVENOUS

## 2013-09-05 MED ORDER — LIDOCAINE HCL (CARDIAC) 20 MG/ML IV SOLN
INTRAVENOUS | Status: DC | PRN
  Administered 2013-09-05: 100 mg via INTRAVENOUS

## 2013-09-05 MED ORDER — LACTATED RINGERS IV SOLN
INTRAVENOUS | Status: DC
  Administered 2013-09-05 (×3): via INTRAVENOUS

## 2013-09-05 SURGICAL SUPPLY — 30 items
ADH SKN CLS APL DERMABOND .7 (GAUZE/BANDAGES/DRESSINGS) ×1
BAG SPEC RTRVL LRG 6X4 10 (ENDOMECHANICALS)
CABLE HIGH FREQUENCY MONO STRZ (ELECTRODE) IMPLANT
CATH ROBINSON RED A/P 16FR (CATHETERS) ×3 IMPLANT
CHLORAPREP W/TINT 26ML (MISCELLANEOUS) ×3 IMPLANT
CLOTH BEACON ORANGE TIMEOUT ST (SAFETY) ×3 IMPLANT
CONT SPECI 4OZ STER CLIK (MISCELLANEOUS) IMPLANT
DERMABOND ADVANCED (GAUZE/BANDAGES/DRESSINGS) ×2
DERMABOND ADVANCED .7 DNX12 (GAUZE/BANDAGES/DRESSINGS) IMPLANT
EVACUATOR SMOKE 8.L (FILTER) ×3 IMPLANT
GLOVE BIO SURGEON STRL SZ 6.5 (GLOVE) ×4 IMPLANT
GLOVE BIO SURGEONS STRL SZ 6.5 (GLOVE) ×2
GOWN STRL REUS W/TWL LRG LVL3 (GOWN DISPOSABLE) ×6 IMPLANT
NS IRRIG 1000ML POUR BTL (IV SOLUTION) ×3 IMPLANT
PACK LAPAROSCOPY BASIN (CUSTOM PROCEDURE TRAY) ×3 IMPLANT
POUCH SPECIMEN RETRIEVAL 10MM (ENDOMECHANICALS) IMPLANT
PROTECTOR NERVE ULNAR (MISCELLANEOUS) ×3 IMPLANT
SCRUB PCMX 4 OZ (MISCELLANEOUS) ×3 IMPLANT
SEALER TISSUE G2 CVD JAW 35 (ENDOMECHANICALS) IMPLANT
SEALER TISSUE G2 CVD JAW 45CM (ENDOMECHANICALS)
SET IRRIG TUBING LAPAROSCOPIC (IRRIGATION / IRRIGATOR) IMPLANT
SUT MNCRL AB 4-0 PS2 18 (SUTURE) ×2 IMPLANT
SUT VICRYL 0 UR6 27IN ABS (SUTURE) ×3 IMPLANT
SUT VICRYL 4-0 PS2 18IN ABS (SUTURE) ×2 IMPLANT
TOWEL OR 17X24 6PK STRL BLUE (TOWEL DISPOSABLE) ×6 IMPLANT
TRAY FOLEY CATH 14FR (SET/KITS/TRAYS/PACK) IMPLANT
TROCAR XCEL NON-BLD 11X100MML (ENDOMECHANICALS) ×3 IMPLANT
TROCAR XCEL NON-BLD 5MMX100MML (ENDOMECHANICALS) ×6 IMPLANT
WARMER LAPAROSCOPE (MISCELLANEOUS) ×3 IMPLANT
WATER STERILE IRR 1000ML POUR (IV SOLUTION) ×3 IMPLANT

## 2013-09-05 NOTE — Discharge Instructions (Signed)
Laparoscopy  Care After Laparoscopic surgery is an operation done with a long, lighted tube inserted through a small cut (incision) in the abdomen. Read the instructions outlined below and refer to this sheet in the next few weeks. These instructions provide you with general information on caring for yourself after you leave the hospital. Your caregiver may also give you specific instructions. While your treatment has been planned according to the most current medical practices available, unavoidable complications may occur. If you have any problems or questions after discharge, please call your caregiver. HOME CARE INSTRUCTIONS  It will be normal to be sore for a couple days following surgery.   Take your medicine and follow the instructions from your caregiver.   You may resume usual diet, exercise, driving and activities as allowed by your caregiver.   Do not have sexual intercourse until your caregiver gives you permission.   Do not drive while taking pain medicine.   Avoid lifting until you are instructed otherwise.   Use showers for bathing, until you are seen by your caregiver.   Change dressings if needed, and as directed.   Only take over-the-counter or prescription medicines for pain, discomfort or fever as directed by your caregiver.   Do not take aspirin because it can cause bleeding.   Take your temperature twice a day and record it.   Have someone stay with you the day you have the operation, and for a couple days afterward.   Make an appointment to see your caregiver for stitches (sutures) or staple removal and postoperative exams, as instructed.  SEEK MEDICAL CARE IF:  There is redness, swelling, or increasing pain in a wound.   There is drainage from a wound lasting longer than one day.   Your pain is getting worse.   You develop a rash.   You are having a reaction to your medicine.   You become dizzy or lightheaded.   You need stronger medicine or a  change in your pain medicine.   You notice a foul smell coming from a wound or dressing.   There is a breaking open of a wound after the stitches, staples, or skin adhesive strips have been removed.   You develop constipation.  SEEK IMMEDIATE MEDICAL CARE IF:  You have an oral temperature above 100.6, not controlled by medicine.   There is increasing abdominal pain.   You develop pain in your shoulders (shoulder strap areas) which becomes more severe. Some pain is common, because of the gas inserted into your abdomen during the procedure.   You develop bleeding or drainage from the suture sites or vagina (birth canal) following surgery.   You pass out.   You develop shortness of breath or difficulty breathing.   You develop chest or leg pain.   You develop persistent nausea, vomiting or diarrhea.  MAKE SURE YOU:   Understand these instructions.   Watch your condition.   Get help right away if you are not doing well or get worse.  Document Released: 11/04/2004 Document Re-Released: 10/05/2009 [facility] Patient Information 2011 [place].  Post Anesthesia Home Care Instructions  Activity: Get plenty of rest for the remainder of the day. A responsible adult should stay with you for 24 hours following the procedure.  For the next 24 hours, DO NOT: -Drive a car -Advertising copywriterperate machinery -Drink alcoholic beverages -Take any medication unless instructed by your physician -Make any legal decisions or sign important papers.  Meals: Start with liquid foods such as  gelatin or soup. Progress to regular foods as tolerated. Avoid greasy, spicy, heavy foods. If nausea and/or vomiting occur, drink only clear liquids until the nausea and/or vomiting subsides. Call your physician if vomiting continues.  Special Instructions/Symptoms: Your throat may feel dry or sore from the anesthesia or the breathing tube placed in your throat during surgery. If this causes discomfort, gargle with warm  salt water. The discomfort should disappear within 24 hours.

## 2013-09-05 NOTE — Anesthesia Procedure Notes (Signed)
Procedure Name: Intubation Date/Time: 09/05/2013 9:33 AM Performed by: Jp Eastham, Jannet AskewHARLESETTA Mills Pre-anesthesia Checklist: Patient identified, Patient being monitored, Emergency Drugs available, Timeout performed and Suction available Patient Re-evaluated:Patient Re-evaluated prior to inductionOxygen Delivery Method: Circle system utilized Preoxygenation: Pre-oxygenation with 100% oxygen Intubation Type: IV induction Ventilation: Mask ventilation without difficulty Laryngoscope Size: Mac and 3 Grade View: Grade I Tube type: Oral Tube size: 7.0 mm Number of attempts: 1 Placement Confirmation: ETT inserted through vocal cords under direct vision,  breath sounds checked- equal and bilateral and positive ETCO2 Secured at: 22 cm Dental Injury: Teeth and Oropharynx as per pre-operative assessment

## 2013-09-05 NOTE — Anesthesia Preprocedure Evaluation (Addendum)
Anesthesia Evaluation  Patient identified by MRN, date of birth, ID band Patient awake    Reviewed: Allergy & Precautions, H&P , Patient's Chart, lab work & pertinent test results, reviewed documented beta blocker date and time   Airway Mallampati: II TM Distance: >3 FB Neck ROM: full    Dental no notable dental hx.    Pulmonary former smoker,  breath sounds clear to auscultation  Pulmonary exam normal       Cardiovascular hypertension, On Medications Rhythm:regular Rate:Normal     Neuro/Psych    GI/Hepatic   Endo/Other    Renal/GU      Musculoskeletal   Abdominal   Peds  Hematology   Anesthesia Other Findings   Reproductive/Obstetrics                           Anesthesia Physical Anesthesia Plan  ASA: II  Anesthesia Plan: General   Post-op Pain Management:    Induction: Intravenous  Airway Management Planned: Oral ETT  Additional Equipment:   Intra-op Plan:   Post-operative Plan: Extubation in OR  Informed Consent: I have reviewed the patients History and Physical, chart, labs and discussed the procedure including the risks, benefits and alternatives for the proposed anesthesia with the patient or authorized representative who has indicated his/her understanding and acceptance.   Dental Advisory Given and Dental advisory given  Plan Discussed with: CRNA and Surgeon  Anesthesia Plan Comments: (  Discussed general anesthesia, including possible nausea, instrumentation of airway, sore throat,pulmonary aspiration, etc. I asked if the were any outstanding questions, or  concerns before we proceeded. )        Anesthesia Quick Evaluation  

## 2013-09-05 NOTE — Transfer of Care (Signed)
Immediate Anesthesia Transfer of Care Note  Patient: Leslie Mills  Procedure(s) Performed: Procedure(s): LAPAROSCOPIC BILATERAL SALPINGECTOMY (Bilateral)  Patient Location: PACU  Anesthesia Type:General  Level of Consciousness: awake, alert  and oriented  Airway & Oxygen Therapy: Patient Spontanous Breathing and Patient connected to nasal cannula oxygen  Post-op Assessment: Report given to PACU RN and Post -op Vital signs reviewed and stable  Post vital signs: Reviewed and stable  Complications: No apparent anesthesia complications

## 2013-09-05 NOTE — H&P (Signed)
  Chief Complaint: 28 y.o.  who presents for a sterilization procedure  Details of Present Illness: Please see above.  BP 142/92  Pulse 82  Temp(Src) 98.4 F (36.9 C) (Oral)  Resp 20  SpO2 100%  Past Medical History  Diagnosis Date  . Lactose intolerance   . Hypertension   . SVD (spontaneous vaginal delivery)     x 1  . Termination of pregnancy    History   Social History  . Marital Status: Single    Spouse Name: N/A    Number of Children: N/A  . Years of Education: N/A   Occupational History  . Not on file.   Social History Main Topics  . Smoking status: Former Smoker -- 0.50 packs/day for 6 years    Quit date: 11/18/2012  . Smokeless tobacco: Never Used  . Alcohol Use: No  . Drug Use: No  . Sexual Activity: Yes    Partners: Male    Birth Control/ Protection: Pill   Other Topics Concern  . Not on file   Social History Narrative  . No narrative on file   Family History  Problem Relation Age of Onset  . Hypertension Mother   . Diabetes Father   . Diabetes Maternal Uncle   . Hypertension Maternal Grandmother   . Diabetes Paternal Grandmother     A comprehensive review of systems was negative.  Pre-Op Diagnosis: Desires Sterilization   Planned Procedure: Procedure(s): LAPAROSCOPIC BILATERAL SALPINGECTOMY  I have reviewed the patient's history and have completed the physical exam and Macy MisSharee N Billig is acceptable for surgery.  Antionette CharLisa Jackson-Moore, MD 09/05/2013 8:40 AM

## 2013-09-05 NOTE — Op Note (Signed)
Procedure Note  Leslie Mills 28 y.o. 09/05/2013  Preoperative Diagnosis:  Multiparity, desires a sterilization procedure  Postoperative Diagnosis: Same  Procedure: Laparoscopic bilateral salpingectomies  Surgeon: Antionette CharLisa Jackson-Moore  Indications:  The patient now presents for a sterilization procedure after discussing therapeutic alternatives.        Procedure Detail:  The patient was taken to the operating room and was placed on the operating table in the dorsal supine position.  After his satisfactory general anesthesia was achieved, the patient was placed in the semi-lithotomy position using Allen stirrups. The patient was prepped and draped in the usual sterile manner for vaginal laparoscopic procedure. A speculum was placed in the vagina. The anterior lip of the cervix was grasped with a single-tooth tenaculum. A Hulka manipulator was then advanced into the uterus and secured  to the anterior lip of the cervix as a means to manipulate the uterus. The single-tooth tenaculum and Hulka manipulator were then removed. The infraumbilical region was then anesthetized with local anesthesia, 0.25%  Marcaine. A small incision was made to the skin and subcutaneous tissue. A 5 mm Optiview trocar was placed through the incision into the abdominal cavity diagnostic laparoscope with video camera attached was placed through the trocar sleeve and carbon dioxide was used to insufflate the abdominal and pelvic cavity. The pelvic contents were examined and the findings were described below. Suprapubic and left lower quadrant ports were placed under direct visualization.    The left fallopian tube was identified and traced out to its fimbriated end. Then starting at the distal fimbriated end, mesosalpinx was progressively coagulated and transected with the Enseal device.  The proximal tube was the coagulated and transected.  The right fallopian tube was then manipulated in a similar fashion. Adequate hemostasis was  noted.  The ports were removed.  The scope was removed and the excess carbon dioxide was remove through the port, before it was removed.  The subcutaneous layer of the suprapubic incision was reapproximated with an interrupted figure-of eight 0-Vicryl suture on a UR 6 needle. The skin was reapproximated with running subcuticluar stitches of 4-0 Vicryl suture.  Dermabond was applied to incisions. The instruments were removed from the vagina and there was minimal bleeding from the cervix. Final sponge, instrument and needle counts were correct. The patient was awakened on the operating table and taken to the PACU in satisfactory condition.    Findings: Omental adhesions to the parieto peritoneum near the umbilicus.  Normal pelvic viscera.  Estimated Blood Loss:  Minimal              Total IV Fluids: per Anesthesiology        Condition: stable

## 2013-09-08 ENCOUNTER — Encounter (HOSPITAL_COMMUNITY): Payer: Self-pay | Admitting: Obstetrics & Gynecology

## 2013-09-08 NOTE — Anesthesia Postprocedure Evaluation (Signed)
  Anesthesia Post-op Note  Patient: Leslie Mills  Procedure(s) Performed: Procedure(s): LAPAROSCOPIC BILATERAL SALPINGECTOMY (Bilateral)  Patient is awake, responsive, moving her legs, and has signs of resolution of her numbness. Pain and nausea are reasonably well controlled. Vital signs are stable and clinically acceptable. Oxygen saturation is clinically acceptable. There are no apparent anesthetic complications at this time. Patient is ready for discharge.

## 2013-09-19 ENCOUNTER — Telehealth: Payer: Self-pay | Admitting: *Deleted

## 2013-09-19 NOTE — Telephone Encounter (Signed)
Pt called in to office for medication refill, Percocet. Pt is post lap salpingectomy, 09/05/13.  Please advise.

## 2013-09-20 NOTE — Telephone Encounter (Signed)
Recommend nsaids/tylenol 

## 2013-09-23 NOTE — Telephone Encounter (Signed)
Left message on voicemail for pt to call back

## 2013-09-24 ENCOUNTER — Telehealth: Payer: Self-pay | Admitting: *Deleted

## 2013-09-24 NOTE — Telephone Encounter (Signed)
Patient notified. Patient states that she is fine now.

## 2013-10-16 ENCOUNTER — Encounter: Payer: Medicaid Other | Admitting: Obstetrics & Gynecology

## 2013-11-04 ENCOUNTER — Emergency Department (HOSPITAL_COMMUNITY)
Admission: EM | Admit: 2013-11-04 | Discharge: 2013-11-04 | Disposition: A | Discharge status: Home / Self Care | Payer: Medicaid Other | Source: Home / Self Care | Attending: Family Medicine | Admitting: Family Medicine

## 2013-11-04 ENCOUNTER — Emergency Department (INDEPENDENT_AMBULATORY_CARE_PROVIDER_SITE_OTHER): Payer: Medicaid Other

## 2013-11-04 ENCOUNTER — Encounter (HOSPITAL_COMMUNITY): Payer: Self-pay | Admitting: Emergency Medicine

## 2013-11-04 DIAGNOSIS — S9032XA Contusion of left foot, initial encounter: Secondary | ICD-10-CM

## 2013-11-04 DIAGNOSIS — IMO0002 Reserved for concepts with insufficient information to code with codable children: Secondary | ICD-10-CM

## 2013-11-04 DIAGNOSIS — S9030XA Contusion of unspecified foot, initial encounter: Secondary | ICD-10-CM

## 2013-11-04 MED ORDER — TRAMADOL HCL 50 MG PO TABS: 50.0000 mg | ORAL_TABLET | Freq: Four times a day (QID) | ORAL | Status: DC | PRN

## 2013-11-04 MED ORDER — IBUPROFEN 800 MG PO TABS
800.0000 mg | ORAL_TABLET | Freq: Three times a day (TID) | ORAL | Status: DC
Start: 2013-11-04 — End: 2013-11-27
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
## 2013-11-04 NOTE — Discharge Instructions (Signed)
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
## 2013-11-05 NOTE — ED Provider Notes (Signed)
Medical screening examination/treatment/procedure(s) were performed by resident physician or non-physician practitioner and as supervising physician I was immediately available for consultation/collaboration.   Jemima Petko DOUGLAS MD.   Safi Culotta D Bernal Luhman, MD 11/05/13 2057 

## 2013-11-27 ENCOUNTER — Other Ambulatory Visit: Payer: Self-pay | Admitting: *Deleted

## 2013-11-27 ENCOUNTER — Telehealth: Payer: Self-pay | Admitting: *Deleted

## 2013-11-27 DIAGNOSIS — N946 Dysmenorrhea, unspecified: Secondary | ICD-10-CM

## 2013-11-27 MED ORDER — IBUPROFEN 800 MG PO TABS: 800.0000 mg | ORAL_TABLET | Freq: Three times a day (TID) | ORAL | Status: DC

## 2013-11-27 NOTE — Telephone Encounter (Signed)
Patient reports she is having increasing pain with her cycles since she had her tubes removed. Patient states she has always hab regular cycles with light flow and that has not changed. She is just having extreme cramping which she never had previous. Per Dr Clearance CootsHarper- may send Ibuprofen 800 mg to the pharmacy.

## 2014-02-13 ENCOUNTER — Other Ambulatory Visit: Payer: Self-pay

## 2014-02-28 ENCOUNTER — Emergency Department (INDEPENDENT_AMBULATORY_CARE_PROVIDER_SITE_OTHER)
Admission: EM | Admit: 2014-02-28 | Discharge: 2014-02-28 | Disposition: A | Discharge status: Home / Self Care | Payer: Medicaid Other | Source: Home / Self Care | Attending: Emergency Medicine | Admitting: Emergency Medicine

## 2014-02-28 ENCOUNTER — Other Ambulatory Visit (HOSPITAL_COMMUNITY)
Admission: RE | Admit: 2014-02-28 | Discharge: 2014-02-28 | Disposition: A | Discharge status: Home / Self Care | Payer: Medicaid Other | Source: Ambulatory Visit | Attending: Emergency Medicine | Admitting: Emergency Medicine

## 2014-02-28 ENCOUNTER — Encounter (HOSPITAL_COMMUNITY): Payer: Self-pay | Admitting: Emergency Medicine

## 2014-02-28 DIAGNOSIS — Z113 Encounter for screening for infections with a predominantly sexual mode of transmission: Secondary | ICD-10-CM | POA: Insufficient documentation

## 2014-02-28 DIAGNOSIS — N760 Acute vaginitis: Secondary | ICD-10-CM

## 2014-02-28 DIAGNOSIS — N3000 Acute cystitis without hematuria: Secondary | ICD-10-CM

## 2014-02-28 LAB — POCT PREGNANCY, URINE: Preg Test, Ur: NEGATIVE

## 2014-02-28 MED ORDER — FLUCONAZOLE 150 MG PO TABS: 150.0000 mg | ORAL_TABLET | Freq: Once | ORAL | Status: DC

## 2014-02-28 MED ORDER — CEPHALEXIN 500 MG PO CAPS: 500.0000 mg | ORAL_CAPSULE | Freq: Three times a day (TID) | ORAL | Status: DC

## 2014-02-28 MED ORDER — METRONIDAZOLE 500 MG PO TABS: 500.0000 mg | ORAL_TABLET | Freq: Two times a day (BID) | ORAL | Status: DC

## 2014-02-28 MED ORDER — KETOCONAZOLE 2 % EX CREA: 1.0000 "application " | TOPICAL_CREAM | Freq: Two times a day (BID) | CUTANEOUS | Status: DC

## 2014-02-28 NOTE — ED Notes (Signed)
Pt states that she has had vaginal irritation and possible exposure to STD. Pt states this started yesterday 02/27/2014

## 2014-02-28 NOTE — Discharge Instructions (Signed)
Urinary Tract Infection °Urinary tract infections (UTIs) can develop anywhere along your urinary tract. Your urinary tract is your body's drainage system for removing wastes and extra water. Your urinary tract includes two kidneys, two ureters, a bladder, and a urethra. Your kidneys are a pair of bean-shaped organs. Each kidney is about the size of your fist. They are located below your ribs, one on each side of your spine. °CAUSES °Infections are caused by microbes, which are microscopic organisms, including fungi, viruses, and bacteria. These organisms are so small that they can only be seen through a microscope. Bacteria are the microbes that most commonly cause UTIs. °SYMPTOMS  °Symptoms of UTIs may vary by age and gender of the patient and by the location of the infection. Symptoms in young women typically include a frequent and intense urge to urinate and a painful, burning feeling in the bladder or urethra during urination. Older women and men are more likely to be tired, shaky, and weak and have muscle aches and abdominal pain. A fever may mean the infection is in your kidneys. Other symptoms of a kidney infection include pain in your back or sides below the ribs, nausea, and vomiting. °DIAGNOSIS °To diagnose a UTI, your caregiver will ask you about your symptoms. Your caregiver also will ask to provide a urine sample. The urine sample will be tested for bacteria and white blood cells. White blood cells are made by your body to help fight infection. °TREATMENT  °Typically, UTIs can be treated with medication. Because most UTIs are caused by a bacterial infection, they usually can be treated with the use of antibiotics. The choice of antibiotic and length of treatment depend on your symptoms and the type of bacteria causing your infection. °HOME CARE INSTRUCTIONS °· If you were prescribed antibiotics, take them exactly as your caregiver instructs you. Finish the medication even if you feel better after you  have only taken some of the medication. °· Drink enough water and fluids to keep your urine clear or pale yellow. °· Avoid caffeine, tea, and carbonated beverages. They tend to irritate your bladder. °· Empty your bladder often. Avoid holding urine for long periods of time. °· Empty your bladder before and after sexual intercourse. °· After a bowel movement, women should cleanse from front to back. Use each tissue only once. °SEEK MEDICAL CARE IF:  °· You have back pain. °· You develop a fever. °· Your symptoms do not begin to resolve within 3 days. °SEEK IMMEDIATE MEDICAL CARE IF:  °· You have severe back pain or lower abdominal pain. °· You develop chills. °· You have nausea or vomiting. °· You have continued burning or discomfort with urination. °MAKE SURE YOU:  °· Understand these instructions. °· Will watch your condition. °· Will get help right away if you are not doing well or get worse. °Document Released: 01/25/2005 Document Revised: 10/17/2011 Document Reviewed: 05/26/2011 °ExitCare® Patient Information ©2015 [employer]. This information is not intended to replace advice given to you by your health care provider. Make sure you discuss any questions you have with your health care provider. ° °Candidal Vulvovaginitis °Candidal vulvovaginitis is an infection of the vagina and vulva. The vulva is the skin around the opening of the vagina. This may cause itching and discomfort in and around the vagina.  °HOME CARE °· Only take medicine as told by your doctor. °· Do not have sex (intercourse) until the infection is healed or as told by your doctor. °· Practice safe   sex. °· Tell your sex partner about your infection. °· Do not douche or use tampons. °· Wear cotton underwear. Do not wear tight pants or panty hose. °· Eat yogurt. This may help treat and prevent yeast infections. °GET HELP RIGHT AWAY IF:  °· You have a fever. °· Your problems get worse during treatment or do not get better in 3 days. °· You have  discomfort, irritation, or itching in your vagina or vulva area. °· You have pain after sex. °· You start to get belly (abdominal) pain. °MAKE SURE YOU: °· Understand these instructions. °· Will watch your condition. °· Will get help right away if you are not doing well or get worse. °Document Released: 07/14/2008 Document Revised: 04/22/2013 Document Reviewed: 07/14/2008 °ExitCare® Patient Information ©2015 [employer]. This information is not intended to replace advice given to you by your health care provider. Make sure you discuss any questions you have with your health care provider. ° °

## 2014-02-28 NOTE — ED Provider Notes (Signed)
Chief Complaint   Vaginal Itching and Exposure to STD   History of Present Illness   Leslie Mills is a 28 year old female who has had a 2 day history of vaginal irritation, itching, and a clumpy, white, non-malodorous vaginal discharge. She's also had some chafing in the groin areas bilaterally. She denies any ulcerations, sores, or blisters on the vulva. She also notes urinary strong odor. She denies any dysuria, frequency, urgency, or hematuria. She had no pelvic or lower back pain. She denies any fever, chills, nausea, or vomiting. Her menses have been regular. She also wants to be checked for STDs. Her last menstrual period began October 16. She is sexually active. She uses no protection but has had a bilateral tubal ligation.  Review of Systems   Other than as noted above, the patient denies any of the following symptoms: Systemic:  No fever or chills GI:  No abdominal pain, nausea, vomiting, diarrhea, constipation, melena or hematochezia. GU:  No dysuria, frequency, urgency, hematuria, vaginal discharge, itching, or abnormal vaginal bleeding.  PMFSH   Past medical history, family history, social history, meds, and allergies were reviewed.    Physical Examination    Vital signs:  BP 145/85  Pulse 98  Temp(Src) 98.5 F (36.9 C) (Oral)  Resp 18  SpO2 100%  LMP 02/12/2014  Breastfeeding? No General:  Alert, oriented and in no distress. Lungs:  Breath sounds clear and equal bilaterally.  No wheezes, rales or rhonchi. Heart:  Regular rhythm.  No gallops or murmers. Abdomen:  Soft, flat and non-distended.  No organomegaly or mass.  No tenderness, guarding or rebound.  Bowel sounds normally active. Pelvic exam:  Normal external genitalia. There was some scaling in the groin areas bilaterally. Vaginal and cervical mucosa was normal. There was a small amount of a clumpy, white, non-malodorous discharge, and some mucoid, yellow discharge coming from the cervical os. There was no  pain on cervical motion. Uterus is normal in size and shape. No adnexal mass or tenderness.  DNA probes for gonorrhea, Chlamydia, Trichomonas, Gardnerella, Candida were obtained. Skin:  Clear, warm and dry.  Labs   Results for orders placed during the hospital encounter of 02/28/14  POCT PREGNANCY, URINE      Result Value Ref Range   Preg Test, Ur NEGATIVE  NEGATIVE    Urine was cultured. UA was positive for nitrites and leukocyte esterase.  Assessment   The primary encounter diagnosis was Vaginitis. A diagnosis of Acute cystitis without hematuria was also pertinent to this visit.       Plan    1.  Meds:  The following meds were prescribed:   Discharge Medication List as of 02/28/2014  4:41 PM    START taking these medications   Details  cephALEXin (KEFLEX) 500 MG capsule Take 1 capsule (500 mg total) by mouth 3 (three) times daily., Starting 02/28/2014, Until Discontinued, Normal    fluconazole (DIFLUCAN) 150 MG tablet Take 1 tablet (150 mg total) by mouth once., Starting 02/28/2014, Normal    ketoconazole (NIZORAL) 2 % cream Apply 1 application topically 2 (two) times daily., Starting 02/28/2014, Until Discontinued, Normal    metroNIDAZOLE (FLAGYL) 500 MG tablet Take 1 tablet (500 mg total) by mouth 2 (two) times daily., Starting 02/28/2014, Until Discontinued, Normal        2.  Patient Education/Counseling:  The patient was given appropriate handouts, self care instructions, and instructed in symptomatic relief.    3.  Follow up:  The patient was  told to follow up here if no better in 3 to 4 days, or sooner if becoming worse in any way, and given some red flag symptoms such as worsening pain, fever, persistent vomiting, or heavy vaginal bleeding which would prompt immediate return.       Reuben Likesavid C Tarrin Lebow, MD 02/28/14 [phone]

## 2014-03-02 ENCOUNTER — Encounter (HOSPITAL_COMMUNITY): Payer: Self-pay | Admitting: Emergency Medicine

## 2014-03-02 LAB — POCT URINALYSIS DIP (DEVICE)
Bilirubin Urine: NEGATIVE
Glucose, UA: NEGATIVE mg/dL
Hgb urine dipstick: NEGATIVE
KETONES UR: NEGATIVE mg/dL
Nitrite: POSITIVE — AB
PH: 6 (ref 5.0–8.0)
Protein, ur: NEGATIVE mg/dL
SPECIFIC GRAVITY, URINE: 1.025 (ref 1.005–1.030)
Urobilinogen, UA: 1 mg/dL (ref 0.0–1.0)

## 2014-03-02 LAB — CERVICOVAGINAL ANCILLARY ONLY
CHLAMYDIA, DNA PROBE: NEGATIVE
NEISSERIA GONORRHEA: NEGATIVE

## 2014-03-03 ENCOUNTER — Telehealth (HOSPITAL_COMMUNITY): Payer: Self-pay | Admitting: *Deleted

## 2014-03-03 LAB — CERVICOVAGINAL ANCILLARY ONLY
Wet Prep (BD Affirm): NEGATIVE
Wet Prep (BD Affirm): POSITIVE — AB
Wet Prep (BD Affirm): POSITIVE — AB

## 2014-03-03 LAB — URINE CULTURE

## 2014-03-03 NOTE — ED Notes (Signed)
GC/Chlamydia neg., Affirm: Candida and Gardnerella pos., Trich neg., Urine culture: >100,000 colonies E. Coli.  Pt. adequately treated with Diflucan, Flagyl and Keflex.  Pt. called in for her lab results.  Pt. verified x 2 and given results.  Pt. told she was adequately treated and to finish all of her medication. Leslie Mills, Leslie Mills 03/03/2014

## 2014-03-03 NOTE — Progress Notes (Signed)
Quick Note:  Results are abnormal as noted, but have been adequately treated. No further action necessary. ______ 

## 2014-04-27 ENCOUNTER — Encounter: Payer: Self-pay | Admitting: *Deleted

## 2014-04-28 ENCOUNTER — Encounter: Payer: Self-pay | Admitting: Obstetrics & Gynecology

## 2014-05-28 NOTE — Telephone Encounter (Signed)
error 

## 2014-08-18 ENCOUNTER — Emergency Department (HOSPITAL_COMMUNITY)
Admission: EM | Admit: 2014-08-18 | Discharge: 2014-08-18 | Disposition: A | Discharge status: Home / Self Care | Payer: Medicaid Other | Attending: Emergency Medicine | Admitting: Emergency Medicine

## 2014-08-18 ENCOUNTER — Encounter (HOSPITAL_COMMUNITY): Payer: Self-pay | Admitting: Emergency Medicine

## 2014-08-18 DIAGNOSIS — Z79899 Other long term (current) drug therapy: Secondary | ICD-10-CM | POA: Insufficient documentation

## 2014-08-18 DIAGNOSIS — Z792 Long term (current) use of antibiotics: Secondary | ICD-10-CM | POA: Insufficient documentation

## 2014-08-18 DIAGNOSIS — Z8639 Personal history of other endocrine, nutritional and metabolic disease: Secondary | ICD-10-CM | POA: Insufficient documentation

## 2014-08-18 DIAGNOSIS — Z91040 Latex allergy status: Secondary | ICD-10-CM | POA: Insufficient documentation

## 2014-08-18 DIAGNOSIS — Z008 Encounter for other general examination: Secondary | ICD-10-CM | POA: Diagnosis present

## 2014-08-18 DIAGNOSIS — I10 Essential (primary) hypertension: Secondary | ICD-10-CM | POA: Insufficient documentation

## 2014-08-18 DIAGNOSIS — Z87891 Personal history of nicotine dependence: Secondary | ICD-10-CM | POA: Diagnosis not present

## 2014-08-18 NOTE — ED Notes (Signed)
Pt stated that she had high BP during her pregnancy..  She stated that she is not currently taking medication. She is requesting a clearence by her medical doctor to start new job.

## 2014-08-18 NOTE — Discharge Instructions (Signed)
1. Medications: usual home medications 2. Treatment: rest, drink plenty of fluids,  3. Follow Up: Please followup with your primary doctor on May 16 for discussion of your diagnoses and further evaluation after today's visit; if you do not have a primary care doctor use the resource guide provided to find one; Please return to the ER for development of severe headaches with vision changes, Chest pain or other concerning symptoms    Hypertension Hypertension, commonly called high blood pressure, is when the force of blood pumping through your arteries is too strong. Your arteries are the blood vessels that carry blood from your heart throughout your body. A blood pressure reading consists of a higher number over a lower number, such as 110/72. The higher number (systolic) is the pressure inside your arteries when your heart pumps. The lower number (diastolic) is the pressure inside your arteries when your heart relaxes. Ideally you want your blood pressure below 120/80. Hypertension forces your heart to work harder to pump blood. Your arteries may become narrow or stiff. Having hypertension puts you at risk for heart disease, stroke, and other problems.  RISK FACTORS Some risk factors for high blood pressure are controllable. Others are not.  Risk factors you cannot control include:   Race. You may be at higher risk if you are African American.  Age. Risk increases with age.  Gender. Men are at higher risk than women before age 28 years. After age 28, women are at higher risk than men. Risk factors you can control include:  Not getting enough exercise or physical activity.  Being overweight.  Getting too much fat, sugar, calories, or salt in your diet.  Drinking too much alcohol. SIGNS AND SYMPTOMS Hypertension does not usually cause signs or symptoms. Extremely high blood pressure (hypertensive crisis) may cause headache, anxiety, shortness of breath, and nosebleed. DIAGNOSIS  To check  if you have hypertension, your health care provider will measure your blood pressure while you are seated, with your arm held at the level of your heart. It should be measured at least twice using the same arm. Certain conditions can cause a difference in blood pressure between your right and left arms. A blood pressure reading that is higher than normal on one occasion does not mean that you need treatment. If one blood pressure reading is high, ask your health care provider about having it checked again. TREATMENT  Treating high blood pressure includes making lifestyle changes and possibly taking medicine. Living a healthy lifestyle can help lower high blood pressure. You may need to change some of your habits. Lifestyle changes may include:  Following the DASH diet. This diet is high in fruits, vegetables, and whole grains. It is low in salt, red meat, and added sugars.  Getting at least 2 hours of brisk physical activity every week.  Losing weight if necessary.  Not smoking.  Limiting alcoholic beverages.  Learning ways to reduce stress. If lifestyle changes are not enough to get your blood pressure under control, your health care provider may prescribe medicine. You may need to take more than one. Work closely with your health care provider to understand the risks and benefits. HOME CARE INSTRUCTIONS  Have your blood pressure rechecked as directed by your health care provider.   Take medicines only as directed by your health care provider. Follow the directions carefully. Blood pressure medicines must be taken as prescribed. The medicine does not work as well when you skip doses. Skipping doses also puts you  at risk for problems.   Do not smoke.   Monitor your blood pressure at home as directed by your health care provider. SEEK MEDICAL CARE IF:   You think you are having a reaction to medicines taken.  You have recurrent headaches or feel dizzy.  You have swelling in your  ankles.  You have trouble with your vision. SEEK IMMEDIATE MEDICAL CARE IF:  You develop a severe headache or confusion.  You have unusual weakness, numbness, or feel faint.  You have severe chest or abdominal pain.  You vomit repeatedly.  You have trouble breathing. MAKE SURE YOU:   Understand these instructions.  Will watch your condition.  Will get help right away if you are not doing well or get worse. Document Released: 04/17/2005 Document Revised: 09/01/2013 Document Reviewed: 02/07/2013 [facility] Patient Information 2015 [place], [place]. This information is not intended to replace advice given to you by your health care provider. Make sure you discuss any questions you have with your health care provider.

## 2014-08-18 NOTE — ED Provider Notes (Signed)
CSN: [id number]     Arrival date & time 08/18/14  0932 History   First MD Initiated Contact with Patient 08/18/14 1014     Chief Complaint  Patient presents with  . Hypertension    pt is requesting clearance to start a job     (Consider location/radiation/quality/duration/timing/severity/associated sxs/prior Treatment) The history is provided by the patient and medical records. No language interpreter was used.     Leslie Mills is a 29 y.o. female  with a hx of PIH presents to the Emergency Department requesting medical clearance for a new job.  Pt reports she had borderline she was on BP medication during her pregnancy and stopped the medication after the birth in march 2015.  Patient reports a strong family history of hypertension.  She reports she is asymptomatic, but after her physical for her new job it was requested that she see a primary care physician to address her hypertension.  She reports that she has obtained a primary care physician but is unable to have a new patient appointment until May 16. She reports she needs a letter of medical clearance to begin her new job. Pt denies headaches, neck pain, chest pain, SOB, abd pain, N/V/D, weakness, syncope dysuria.  Pt reports she has lost the baby weight without resolution of the HTN.     PCP: [facility] - First appointment on May 16th  Past Medical History  Diagnosis Date  . Lactose intolerance   . Hypertension   . SVD (spontaneous vaginal delivery)     x 1  . Termination of pregnancy    Past Surgical History  Procedure Laterality Date  . Cesarean section    . Laparoscopic bilateral salpingectomy Bilateral 09/05/2013    Procedure: LAPAROSCOPIC BILATERAL SALPINGECTOMY;  Surgeon: Antionette Char, MD;  Location: [facility];  Service: Gynecology;  Laterality: Bilateral;   Family History  Problem Relation Age of Onset  . Hypertension Mother   . Diabetes Father   . Diabetes Maternal Uncle   . Hypertension Maternal  Grandmother   . Diabetes Paternal Grandmother    History  Substance Use Topics  . Smoking status: Former Smoker -- 0.50 packs/day for 6 years    Quit date: 11/18/2012  . Smokeless tobacco: Never Used  . Alcohol Use: No   OB History    Gravida Para Term Preterm AB TAB SAB Ectopic Multiple Living   Review of Systems  Constitutional: Negative for fever, diaphoresis, appetite change, fatigue and unexpected weight change.  HENT: Negative for mouth sores.   Eyes: Negative for visual disturbance.  Respiratory: Negative for cough, chest tightness, shortness of breath and wheezing.   Cardiovascular: Negative for chest pain.  Gastrointestinal: Negative for nausea, vomiting, abdominal pain, diarrhea and constipation.  Endocrine: Negative for polydipsia, polyphagia and polyuria.  Genitourinary: Negative for dysuria, urgency, frequency and hematuria.  Musculoskeletal: Negative for back pain and neck stiffness.  Skin: Negative for rash.  Allergic/Immunologic: Negative for immunocompromised state.  Neurological: Negative for syncope, light-headedness and headaches.  Hematological: Does not bruise/bleed easily.  Psychiatric/Behavioral: Negative for sleep disturbance. The patient is not nervous/anxious.       Allergies  Lactose intolerance (gi); Milk-related compounds; and Latex  Home Medications   Prior to Admission medications   Medication Sig Start Date End Date Taking? Authorizing Provider  cephALEXin (KEFLEX) 500 MG capsule Take 1 capsule (500 mg total) by mouth 3 (three) times daily.  02/28/14   Reuben Likesavid C Keller, MD  fluconazole (DIFLUCAN) 150 MG tablet Take 1 tablet (150 mg total) by mouth once. 02/28/14   Reuben Likesavid C Keller, MD  ibuprofen (ADVIL,MOTRIN) 800 MG tablet Take 1 tablet (800 mg total) by mouth 3 (three) times daily. 11/27/13   Brock Badharles A Harper, MD  ketoconazole (NIZORAL) 2 % cream Apply 1 application topically 2 (two) times daily. 02/28/14   Reuben Likesavid C Keller,  MD  metroNIDAZOLE (FLAGYL) 500 MG tablet Take 1 tablet (500 mg total) by mouth 2 (two) times daily. 02/28/14   Reuben Likesavid C Keller, MD  NIFEdipine (PROCARDIA-XL/ADALAT-CC/NIFEDICAL-XL) 30 MG 24 hr tablet Take 30 mg by mouth daily.    Historical Provider, MD  oxyCODONE-acetaminophen (PERCOCET) 5-325 MG per tablet Take 2 tablets by mouth every 6 (six) hours as needed for severe pain. 09/05/13   Antionette CharLisa Jackson-Moore, MD  traMADol (ULTRAM) 50 MG tablet Take 1 tablet (50 mg total) by mouth every 6 (six) hours as needed. 11/04/13   Adrian BlackwaterZachary H Baker, PA-C   BP 153/106 mmHg  Pulse 81  Temp(Src) 98.8 F (37.1 C) (Oral)  Resp 15  Ht 5\' 3"  (1.6 m)  Wt 179 lb (81.194 kg)  BMI 31.72 kg/m2  SpO2 98%  LMP 08/07/2014 (Exact Date)  Breastfeeding? No Physical Exam  Constitutional: She appears well-developed and well-nourished. No distress.  Awake, alert, nontoxic appearance  HENT:  Head: Normocephalic and atraumatic.  Mouth/Throat: Oropharynx is clear and moist. No oropharyngeal exudate.  Eyes: Conjunctivae are normal. No scleral icterus.  Neck: Normal range of motion. Neck supple.  Cardiovascular: Normal rate, regular rhythm, normal heart sounds and intact distal pulses.   No murmur heard. Pulmonary/Chest: Effort normal and breath sounds normal. No respiratory distress. She has no wheezes.  Equal chest expansion  Abdominal: Soft. Bowel sounds are normal. She exhibits no distension and no mass. There is no tenderness. There is no rebound and no guarding.  Musculoskeletal: Normal range of motion. She exhibits no edema.  Neurological: She is alert.  Speech is clear and goal oriented Moves extremities without ataxia  Skin: Skin is warm and dry. No rash noted. She is not diaphoretic. No erythema.  Psychiatric: She has a normal mood and affect.  Nursing note and vitals reviewed.   ED Course  Procedures (including critical care time) Labs Review Labs Reviewed - No data to display  Imaging Review No results  found.   EKG Interpretation None      MDM   Final diagnoses:  Essential hypertension   Leslie Mills presents for medical clearance with a history of high blood pressure. Patient's blood pressure 153/106 here in the emergency department. She is asymptomatic.  Discussed diet, exercise and lifestyle changes for the next several weeks until she is able to see her primary care physician. We'll delay beginning medication at this time.  Will give patient a letter of medical clearance pending her appointment with her new PCP.    BP 153/106 mmHg  Pulse 81  Temp(Src) 98.8 F (37.1 C) (Oral)  Resp 15  Ht 5\' 3"  (1.6 m)  Wt 179 lb (81.194 kg)  BMI 31.72 kg/m2  SpO2 98%  LMP 08/07/2014 (Exact Date)  Breastfeeding? No   Dierdre ForthHannah Amiliah Campisi, PA-C 08/18/14 1037  Toy CookeyMegan Docherty, MD 08/18/14 2118

## 2014-10-02 ENCOUNTER — Encounter (HOSPITAL_COMMUNITY): Payer: Self-pay | Admitting: Emergency Medicine

## 2014-10-02 ENCOUNTER — Emergency Department (HOSPITAL_COMMUNITY)
Admission: EM | Admit: 2014-10-02 | Discharge: 2014-10-02 | Disposition: A | Discharge status: Home / Self Care | Payer: Medicaid Other | Attending: Emergency Medicine | Admitting: Emergency Medicine

## 2014-10-02 DIAGNOSIS — Z87891 Personal history of nicotine dependence: Secondary | ICD-10-CM | POA: Insufficient documentation

## 2014-10-02 DIAGNOSIS — Z8639 Personal history of other endocrine, nutritional and metabolic disease: Secondary | ICD-10-CM | POA: Insufficient documentation

## 2014-10-02 DIAGNOSIS — Z3202 Encounter for pregnancy test, result negative: Secondary | ICD-10-CM | POA: Diagnosis not present

## 2014-10-02 DIAGNOSIS — R42 Dizziness and giddiness: Secondary | ICD-10-CM | POA: Diagnosis not present

## 2014-10-02 DIAGNOSIS — Z91040 Latex allergy status: Secondary | ICD-10-CM | POA: Insufficient documentation

## 2014-10-02 DIAGNOSIS — Z79899 Other long term (current) drug therapy: Secondary | ICD-10-CM | POA: Diagnosis not present

## 2014-10-02 DIAGNOSIS — I10 Essential (primary) hypertension: Secondary | ICD-10-CM | POA: Insufficient documentation

## 2014-10-02 LAB — I-STAT CHEM 8, ED
BUN: 6 mg/dL (ref 6–20)
CALCIUM ION: 1.17 mmol/L (ref 1.12–1.23)
CHLORIDE: 106 mmol/L (ref 101–111)
Creatinine, Ser: 0.7 mg/dL (ref 0.44–1.00)
Glucose, Bld: 92 mg/dL (ref 65–99)
HCT: 38 % (ref 36.0–46.0)
Hemoglobin: 12.9 g/dL (ref 12.0–15.0)
POTASSIUM: 3.6 mmol/L (ref 3.5–5.1)
Sodium: 140 mmol/L (ref 135–145)
TCO2: 20 mmol/L (ref 0–100)

## 2014-10-02 LAB — POC URINE PREG, ED: Preg Test, Ur: NEGATIVE

## 2014-10-02 MED ORDER — HYDROCHLOROTHIAZIDE 25 MG PO TABS: 25.0000 mg | ORAL_TABLET | Freq: Every day | ORAL | Status: DC

## 2014-10-02 MED ORDER — SODIUM CHLORIDE 0.9 % IV BOLUS (SEPSIS)
1000.0000 mL | INTRAVENOUS | Status: AC
  Administered 2014-10-02: 1000 mL via INTRAVENOUS

## 2014-10-02 NOTE — ED Notes (Signed)
Discharge instructions reviewed with pt per [place].

## 2014-10-02 NOTE — Discharge Instructions (Signed)

## 2014-10-02 NOTE — ED Notes (Addendum)
Pt reports dizziness onset today. Pt present time complaint of bilateral eye pressure with no light sensitivity; pt states "normal to get eye pressure when missing nap or rest." Pt continues to reports start of menstrual cycle Sunday. Upon assessment neuro unremarkable. Pt then reports same episode two weeks ago but reports symptoms subsided.

## 2014-10-02 NOTE — ED Notes (Signed)
Pt reports dizzy "spells" today. She denies any LOC or falling to hit her head. But she does fell like "i am going pass out or fall over". Pt ambulatory to triage. BP 171/97 in triage and says that her BP never went down after pregnancy.

## 2014-10-08 NOTE — ED Provider Notes (Signed)
CSN: [id number]     Arrival date & time 10/02/14  1032 History   First MD Initiated Contact with Patient 10/02/14 1219     No chief complaint on file.    (Consider location/radiation/quality/duration/timing/severity/associated sxs/prior Treatment) Patient is a 29 y.o. female presenting with dizziness. The history is provided by the patient.  Dizziness Quality:  Lightheadedness Severity:  Mild Onset quality:  Gradual Timing:  Intermittent Progression:  Unchanged Chronicity:  New Context: standing up   Relieved by:  Lying down Worsened by:  Standing up Ineffective treatments:  None tried Associated symptoms: no chest pain, no diarrhea, no headaches, no nausea, no shortness of breath and no vomiting     Past Medical History  Diagnosis Date  . Lactose intolerance   . Hypertension   . SVD (spontaneous vaginal delivery)     x 1  . Termination of pregnancy    Past Surgical History  Procedure Laterality Date  . Cesarean section    . Laparoscopic bilateral salpingectomy Bilateral 09/05/2013    Procedure: LAPAROSCOPIC BILATERAL SALPINGECTOMY;  Surgeon: Antionette Char, MD;  Location: [facility];  Service: Gynecology;  Laterality: Bilateral;   Family History  Problem Relation Age of Onset  . Hypertension Mother   . Diabetes Father   . Diabetes Maternal Uncle   . Hypertension Maternal Grandmother   . Diabetes Paternal Grandmother    History  Substance Use Topics  . Smoking status: Former Smoker -- 0.50 packs/day for 6 years    Quit date: 11/18/2012  . Smokeless tobacco: Never Used  . Alcohol Use: No   OB History    Gravida Para Term Preterm AB TAB SAB Ectopic Multiple Living   Review of Systems  Constitutional: Negative for fever and fatigue.  HENT: Negative for congestion and drooling.   Eyes: Negative for pain.  Respiratory: Negative for cough and shortness of breath.   Cardiovascular: Negative for chest pain.  Gastrointestinal: Negative for  nausea, vomiting, abdominal pain and diarrhea.  Genitourinary: Negative for dysuria and hematuria.  Musculoskeletal: Negative for back pain, gait problem and neck pain.  Skin: Negative for color change.  Neurological: Positive for dizziness. Negative for headaches.  Hematological: Negative for adenopathy.  Psychiatric/Behavioral: Negative for behavioral problems.  All other systems reviewed and are negative.     Allergies  Lactose intolerance (gi); Milk-related compounds; and Latex  Home Medications   Prior to Admission medications   Medication Sig Start Date End Date Taking? Authorizing Provider  acetaminophen (TYLENOL) 500 MG tablet Take 1,000 mg by mouth every 6 (six) hours as needed for mild pain or moderate pain.   Yes Historical Provider, MD  cephALEXin (KEFLEX) 500 MG capsule Take 1 capsule (500 mg total) by mouth 3 (three) times daily. Patient not taking: Reported on 10/02/2014 02/28/14   Reuben Likes, MD  fluconazole (DIFLUCAN) 150 MG tablet Take 1 tablet (150 mg total) by mouth once. Patient not taking: Reported on 10/02/2014 02/28/14   Reuben Likes, MD  hydrochlorothiazide (HYDRODIURIL) 25 MG tablet Take 1 tablet (25 mg total) by mouth daily. 10/02/14   Purvis Sheffield, MD  ibuprofen (ADVIL,MOTRIN) 800 MG tablet Take 1 tablet (800 mg total) by mouth 3 (three) times daily. Patient not taking: Reported on 10/02/2014 11/27/13   Brock Bad, MD  ketoconazole (NIZORAL) 2 % cream Apply 1 application topically 2 (two) times daily. Patient not taking: Reported on 10/02/2014 02/28/14  Reuben Likes, MD  metroNIDAZOLE (FLAGYL) 500 MG tablet Take 1 tablet (500 mg total) by mouth 2 (two) times daily. Patient not taking: Reported on 10/02/2014 02/28/14   Reuben Likes, MD  oxyCODONE-acetaminophen (PERCOCET) 5-325 MG per tablet Take 2 tablets by mouth every 6 (six) hours as needed for severe pain. Patient not taking: Reported on 10/02/2014 09/05/13   Antionette Char, MD  traMADol  (ULTRAM) 50 MG tablet Take 1 tablet (50 mg total) by mouth every 6 (six) hours as needed. Patient not taking: Reported on 10/02/2014 11/04/13   Graylon Good, PA-C   BP 160/86 mmHg  Pulse 57  Temp(Src) 98.3 F (36.8 C) (Oral)  Resp 16  SpO2 100%  LMP 09/27/2014 (Exact Date) Physical Exam  Constitutional: She is oriented to person, place, and time. She appears well-developed and well-nourished.  HENT:  Head: Normocephalic.  Mouth/Throat: Oropharynx is clear and moist. No oropharyngeal exudate.  Eyes: Conjunctivae and EOM are normal. Pupils are equal, round, and reactive to light.  Neck: Normal range of motion. Neck supple.  Cardiovascular: Normal rate, regular rhythm, normal heart sounds and intact distal pulses.  Exam reveals no gallop and no friction rub.   No murmur heard. Pulmonary/Chest: Effort normal and breath sounds normal. No respiratory distress. She has no wheezes.  Abdominal: Soft. Bowel sounds are normal. There is no tenderness. There is no rebound and no guarding.  Musculoskeletal: Normal range of motion. She exhibits no edema or tenderness.  Neurological: She is alert and oriented to person, place, and time.  alert, oriented x3 speech: normal in context and clarity memory: intact grossly cranial nerves II-XII: intact motor strength: full proximally and distally no involuntary movements or tremors sensation: intact to light touch diffusely  cerebellar: finger-to-nose and heel-to-shin intact gait: normal forwards and backwards  Skin: Skin is warm and dry.  Psychiatric: She has a normal mood and affect. Her behavior is normal.  Nursing note and vitals reviewed.   ED Course  Procedures (including critical care time) Labs Review Labs Reviewed  POC URINE PREG, ED  I-STAT CHEM 8, ED    Imaging Review No results found.   EKG Interpretation   Date/Time:  Friday October 02 2014 11:27:00 EDT Ventricular Rate:  61 PR Interval:  157 QRS Duration: 80 QT Interval:   389 QTC Calculation: 392 R Axis:   87 Text Interpretation:  Sinus rhythm Probable left atrial enlargement  Confirmed by Zayden Hahne  MD, Wilgus Deyton ([id number]) on 10/02/2014 12:50:54 PM      MDM   Final diagnoses:  Dizziness     29 y.o. female here w/ mild intermittent dizziness. Also c/o sinus pressure. She states she has had elevated BP since her pregnancy, but is not currently on any bp meds. AFVSS here. Normal neuro exam. Will get screening istat, ecg, upreg, bolus.   I interpreted/reviewed the labs and/or imaging which were non-contributory.  Will start hctz until she can f/u w/ her pcp. Sx resolved w/ IVF.  I have discussed the diagnosis/risks/treatment options with the patient and believe the pt to be eligible for discharge home to follow-up with her pcp. We also discussed returning to the ED immediately if new or worsening sx occur. We discussed the sx which are most concerning (e.g., HA's, weakness, numbness, syncope) that necessitate immediate return. Medications administered to the patient during their visit and any new prescriptions provided to the patient are listed below.  Medications given during this visit Medications  sodium chloride 0.9 % bolus  1,000 mL (0 mLs Intravenous Stopped 10/02/14 1406)    Discharge Medication List as of 10/02/2014  3:12 PM    START taking these medications   Details  hydrochlorothiazide (HYDRODIURIL) 25 MG tablet Take 1 tablet (25 mg total) by mouth daily., Starting 10/02/2014, Until Discontinued, Print         Purvis Sheffield, MD 10/08/14 1045

## 2016-11-29 ENCOUNTER — Encounter (HOSPITAL_COMMUNITY): Payer: Self-pay | Admitting: Emergency Medicine

## 2016-11-29 ENCOUNTER — Ambulatory Visit (HOSPITAL_COMMUNITY)
Admission: EM | Admit: 2016-11-29 | Discharge: 2016-11-29 | Disposition: A | Discharge status: Home / Self Care | Payer: Medicaid Other | Attending: Family Medicine | Admitting: Family Medicine

## 2016-11-29 DIAGNOSIS — S161XXA Strain of muscle, fascia and tendon at neck level, initial encounter: Secondary | ICD-10-CM

## 2016-11-29 MED ORDER — MELOXICAM 15 MG PO TABS: 15.0000 mg | ORAL_TABLET | Freq: Every day | ORAL | 0 refills | Status: DC
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
## 2017-01-23 ENCOUNTER — Encounter (HOSPITAL_COMMUNITY): Payer: Self-pay | Admitting: Emergency Medicine

## 2017-01-23 ENCOUNTER — Ambulatory Visit (HOSPITAL_COMMUNITY)
Admission: EM | Admit: 2017-01-23 | Discharge: 2017-01-23 | Disposition: A | Discharge status: Home / Self Care | Payer: Self-pay | Attending: Urgent Care | Admitting: Urgent Care

## 2017-01-23 DIAGNOSIS — Z8249 Family history of ischemic heart disease and other diseases of the circulatory system: Secondary | ICD-10-CM | POA: Insufficient documentation

## 2017-01-23 DIAGNOSIS — I10 Essential (primary) hypertension: Secondary | ICD-10-CM | POA: Insufficient documentation

## 2017-01-23 DIAGNOSIS — R030 Elevated blood-pressure reading, without diagnosis of hypertension: Secondary | ICD-10-CM

## 2017-01-23 DIAGNOSIS — Z833 Family history of diabetes mellitus: Secondary | ICD-10-CM | POA: Insufficient documentation

## 2017-01-23 DIAGNOSIS — F17210 Nicotine dependence, cigarettes, uncomplicated: Secondary | ICD-10-CM | POA: Insufficient documentation

## 2017-01-23 DIAGNOSIS — E739 Lactose intolerance, unspecified: Secondary | ICD-10-CM | POA: Insufficient documentation

## 2017-01-23 DIAGNOSIS — N3090 Cystitis, unspecified without hematuria: Secondary | ICD-10-CM | POA: Insufficient documentation

## 2017-01-23 LAB — POCT I-STAT, CHEM 8
BUN: 8 mg/dL (ref 6–20)
Calcium, Ion: 1.22 mmol/L (ref 1.15–1.40)
Chloride: 106 mmol/L (ref 101–111)
Creatinine, Ser: 0.7 mg/dL (ref 0.44–1.00)
Glucose, Bld: 95 mg/dL (ref 65–99)
HEMATOCRIT: 41 % (ref 36.0–46.0)
Hemoglobin: 13.9 g/dL (ref 12.0–15.0)
Potassium: 4.3 mmol/L (ref 3.5–5.1)
SODIUM: 141 mmol/L (ref 135–145)
TCO2: 24 mmol/L (ref 22–32)

## 2017-01-23 LAB — POCT URINALYSIS DIP (DEVICE)
BILIRUBIN URINE: NEGATIVE
Glucose, UA: NEGATIVE mg/dL
LEUKOCYTES UA: NEGATIVE
Nitrite: POSITIVE — AB
Protein, ur: 30 mg/dL — AB
Urobilinogen, UA: 0.2 mg/dL (ref 0.0–1.0)
pH: 5.5 (ref 5.0–8.0)

## 2017-01-23 MED ORDER — SULFAMETHOXAZOLE-TRIMETHOPRIM 800-160 MG PO TABS: 1.0000 | ORAL_TABLET | Freq: Two times a day (BID) | ORAL | 0 refills | Status: DC

## 2017-01-23 MED ORDER — AMLODIPINE BESYLATE 5 MG PO TABS: 5.0000 mg | ORAL_TABLET | Freq: Every day | ORAL | 0 refills | Status: DC

## 2017-01-23 NOTE — ED Triage Notes (Signed)
Pt reports a bad headache, body aches and nausea yesterday at work.  She drank some water, went home and slept all day.  She woke up today feeling a little better, but reports she has HBP issues, but does not have insurance yet to set up a PCP visit.

## 2017-01-23 NOTE — Discharge Instructions (Signed)
If you would like to continue with me as your PCP, you can set up an appointment with me at [facility] by calling [phone]. There is also PA-Stephanie English, PA-Michael Clark.

## 2017-01-23 NOTE — ED Provider Notes (Signed)
MRN: [id number] DOB: 04-27-1986  Subjective:   Leslie Mills is a 31 y.o. female presenting for chief complaint of Headache and Nausea  Reports 1 day history of very mild headache over right temporal side. Yesterday was more severe, hydrated well and rested. She has not taken any medications for her headache. Has associated nausea, body aches, subjective fever, cloudy urine. Denies dizziness, confusion, weakness, chest pain, shob, vomiting, abdominal pain, flank pain, dysuria, urinary frequency, malodorous urine, lower leg swelling. Smokes ~3 cigarettes per day.    Leslie Mills is not currently taking any medications and is allergic to lactose intolerance (gi); milk-related compounds; and latex.  Leslie Mills  has a past medical history of Hypertension; Lactose intolerance; SVD (spontaneous vaginal delivery); and Termination of pregnancy. Also  has a past surgical history that includes Cesarean section and Laparoscopic bilateral salpingectomy (Bilateral, 09/05/2013). Her family history includes Diabetes in her father, maternal uncle, and paternal grandmother; Hypertension in her maternal grandmother and mother.   Objective:   Vitals: BP (!) 191/115 (BP Location: Left Arm)   Pulse 71   Temp 98.9 F (37.2 C) (Oral)   LMP 12/30/2016 (Exact Date)   SpO2 100%   BP Readings from Last 3 Encounters:  01/23/17 (!) 191/115  11/29/16 (!) 152/94  10/02/14 160/86   Physical Exam  Constitutional: She is oriented to person, place, and time. She appears well-developed and well-nourished.  HENT:  Mouth/Throat: Oropharynx is clear and moist.  Eyes: Pupils are equal, round, and reactive to light. EOM are normal. No scleral icterus.  Neck: Normal range of motion. Neck supple.  Cardiovascular: Normal rate, regular rhythm and intact distal pulses.  Exam reveals no gallop and no friction rub.   No murmur heard. Pulmonary/Chest: No respiratory distress. She has no wheezes. She has no rales.  Abdominal: Soft. Bowel sounds  are normal. She exhibits no distension and no mass. There is no tenderness. There is no guarding.  Musculoskeletal: She exhibits no edema.  Neurological: She is alert and oriented to person, place, and time. She displays normal reflexes. No cranial nerve deficit.  Skin: Skin is warm and dry.  Psychiatric: She has a normal mood and affect.   ED ECG REPORT   Date: 01/23/2017  Rate: 76bpm  Rhythm: normal sinus rhythm  QRS Axis: normal  Intervals: normal  ST/T Wave abnormalities: Non-specific T-wave flattening in Lead III, V1  Conduction Disutrbances:none  Narrative Interpretation: T-wave flattening in lead III is only change and is non-specific. Otherwise very comparable to ecg from 10/03/2014. No acute changes.  Old EKG Reviewed: 10/03/2014  I have personally reviewed the EKG tracing and agree with the computerized printout as noted.   Results for orders placed or performed during the hospital encounter of 01/23/17 (from the past 24 hour(s))  I-STAT, chem 8     Status: None   Collection Time: 01/23/17  2:23 PM  Result Value Ref Range   Sodium 141 135 - 145 mmol/L   Potassium 4.3 3.5 - 5.1 mmol/L   Chloride 106 101 - 111 mmol/L   BUN 8 6 - 20 mg/dL   Creatinine, Ser 9.32 0.44 - 1.00 mg/dL   Glucose, Bld 95 65 - 99 mg/dL   Calcium, Ion 3.55 7.32 - 1.40 mmol/L   TCO2 24 22 - 32 mmol/L   Hemoglobin 13.9 12.0 - 15.0 g/dL   HCT 20.2 54.2 - 70.6 %  POCT urinalysis dip (device)     Status: Abnormal   Collection Time: 01/23/17  2:23 PM  Result Value Ref Range   Glucose, UA NEGATIVE NEGATIVE mg/dL   Bilirubin Urine NEGATIVE NEGATIVE   Ketones, ur TRACE (A) NEGATIVE mg/dL   Specific Gravity, Urine >=1.030 1.005 - 1.030   Hgb urine dipstick MODERATE (A) NEGATIVE   pH 5.5 5.0 - 8.0   Protein, ur 30 (A) NEGATIVE mg/dL   Urobilinogen, UA 0.2 0.0 - 1.0 mg/dL   Nitrite POSITIVE (A) NEGATIVE   Leukocytes, UA NEGATIVE NEGATIVE   Assessment and Plan :   Cystitis  Essential  hypertension  Elevated blood pressure reading  Will start patient on Bactrim to address cystitis. Physical exam findings reassuring, ecg does not show an acute process. Will start her on amlodipine  and explained that her BP cannot be lowered to quickly as this can also be dangerous. Return-to-clinic precautions discussed, patient verbalized understanding. Patient will look to establish primary care. Otherwise f/u in 1 week.  Wallis Bamberg, PA-C [facility]  01/23/2017  2:02 PM    Wallis Bamberg, PA-C 01/23/17 [phone]

## 2017-01-26 LAB — URINE CULTURE

## 2017-02-19 ENCOUNTER — Other Ambulatory Visit: Payer: Self-pay | Admitting: Urgent Care

## 2017-03-27 ENCOUNTER — Other Ambulatory Visit: Payer: Self-pay | Admitting: Urgent Care

## 2017-03-27 NOTE — Telephone Encounter (Signed)
Pt called and notified that an appt was needed in order to have refill of medication. Pt last seen in September at the Urgent Care. Pt has not been seen at the facility for an annual visit. Pt states she does not have insurance and can't afford self-pay in order to be seen for an annual physical. Pt states she will just go to the urgent care in order to get medications.

## 2017-03-27 NOTE — Telephone Encounter (Signed)
Copied from CRM [phone]. Topic: Quick Communication - See Telephone Encounter >> Mar 27, 2017  4:18 PM Terisa Starraylor, Brittany L wrote: CRM for notification. See Telephone encounter for:  Patient needs refill on her AMLODIPINE 5mg . Please send to [facility] on [address].  03/27/17.

## 2019-09-25 ENCOUNTER — Encounter: Payer: Self-pay | Admitting: Emergency Medicine

## 2019-09-25 ENCOUNTER — Ambulatory Visit
Admission: EM | Admit: 2019-09-25 | Discharge: 2019-09-25 | Disposition: A | Discharge status: Home / Self Care | Payer: Self-pay

## 2019-09-25 ENCOUNTER — Other Ambulatory Visit: Payer: Self-pay

## 2019-09-25 DIAGNOSIS — I10 Essential (primary) hypertension: Secondary | ICD-10-CM

## 2019-09-25 DIAGNOSIS — Z760 Encounter for issue of repeat prescription: Secondary | ICD-10-CM

## 2019-09-25 MED ORDER — AMLODIPINE BESYLATE 5 MG PO TABS: 5.0000 mg | ORAL_TABLET | Freq: Every day | ORAL | 1 refills | Status: DC

## 2019-09-25 NOTE — ED Provider Notes (Signed)
[name]    CSN: [id number] Arrival date & time: 09/25/19  1702      History   Chief Complaint Chief Complaint  Patient presents with  . Hypertension    HPI Leslie Mills is a 34 y.o. female with history of hypertension presenting for medication refill.  States she has been out of her amlodipine "for a while now".  Was at her OB/GYN office the other day and had a reading of 179/112: Was told to follow-up with PCP for hypertensive management.  Patient has not had PCP in over a year.  Past Medical History:  Diagnosis Date  . Hypertension   . Lactose intolerance   . SVD (spontaneous vaginal delivery)    x 1  . Termination of pregnancy     Patient Active Problem List   Diagnosis Date Noted  . VBAC, delivered, current hospitalization 07/10/2013  . Normal delivery 07/10/2013  . Indication for care in labor or delivery 07/09/2013  . Essential hypertension antepartum 05/07/2013  . BV (bacterial vaginosis) 03/13/2013  . Echogenic intracardiac focus of fetus on prenatal ultrasound 02/14/2013  . Asymptomatic bacteriuria in pregnancy 12/06/2012  . Previous cesarean section 12/02/2012  . Supervision of other normal pregnancy 12/02/2012    Past Surgical History:  Procedure Laterality Date  . CESAREAN SECTION    . LAPAROSCOPIC BILATERAL SALPINGECTOMY Bilateral 09/05/2013   Procedure: LAPAROSCOPIC BILATERAL SALPINGECTOMY;  Surgeon: Antionette Char, MD;  Location: [facility];  Service: Gynecology;  Laterality: Bilateral;    OB History    Gravida  3   Para  2   Term  2   Preterm      AB  1   Living  2     SAB      TAB  1   Ectopic      Multiple      Live Births  2            Home Medications    Prior to Admission medications   Medication Sig Start Date End Date Taking? Authorizing Provider  acetaminophen (TYLENOL) 500 MG tablet Take 1,000 mg by mouth every 6 (six) hours as needed for mild pain or moderate pain.    [provider]   amLODipine (NORVASC) 5 MG tablet Take 1 tablet (5 mg total) by mouth daily. 09/25/19   Hall-Potvin, [place], PA-C  hydrochlorothiazide (HYDRODIURIL) 25 MG tablet Take 1 tablet (25 mg total) by mouth daily. 10/02/14   Purvis Sheffield, MD    Family History Family History  Problem Relation Age of Onset  . Hypertension Mother   . Diabetes Father   . Diabetes Maternal Uncle   . Hypertension Maternal Grandmother   . Diabetes Paternal Grandmother     Social History Social History   Tobacco Use  . Smoking status: Former Smoker    Packs/day: 0.50    Years: 6.00    Pack years: 3.00    Quit date: 11/18/2012    Years since quitting: 6.8  . Smokeless tobacco: Never Used  Substance Use Topics  . Alcohol use: No  . Drug use: No     Allergies   Lactose intolerance (gi), Milk-related compounds, and Latex   Review of Systems As per HPI   Physical Exam Triage Vital Signs ED Triage Vitals  Enc Vitals Group     BP      Pulse      Resp      Temp      Temp src  SpO2      Weight      Height      Head Circumference      Peak Flow      Pain Score      Pain Loc      Pain Edu?      Excl. in [place]?    No data found.  Updated Vital Signs BP (!) 157/98   Pulse 81   Temp 98 F (36.7 C)   Resp 16   LMP 09/25/2019   SpO2 99%   Visual Acuity Right Eye Distance:   Left Eye Distance:   Bilateral Distance:    Right Eye Near:   Left Eye Near:    Bilateral Near:     Physical Exam Constitutional:      General: She is not in acute distress. HENT:     Head: Normocephalic and atraumatic.  Eyes:     General: No scleral icterus.    Pupils: Pupils are equal, round, and reactive to light.  Cardiovascular:     Rate and Rhythm: Normal rate.  Pulmonary:     Effort: Pulmonary effort is normal.  Skin:    Coloration: Skin is not jaundiced or pale.  Neurological:     Mental Status: She is alert and oriented to person, place, and time.      UC Treatments / Results   Labs (all labs ordered are listed, but only abnormal results are displayed) Labs Reviewed - No data to display  EKG   Radiology No results found.  Procedures Procedures (including critical care time)  Medications Ordered in UC Medications - No data to display  Initial Impression / Assessment and Plan / UC Course  I have reviewed the triage vital signs and the nursing notes.  Pertinent labs & imaging results that were available during my care of the patient were reviewed by me and considered in my medical decision making (see chart for details).     Patient is hypertensive, though without signs/symptoms of endorgan damage.  Provided contact information for PCP office as well as education on importance of hypertensive management.  Amlodipine sent as patient has tolerated this well in the past.  Return precautions discussed, patient verbalized understanding and is agreeable to plan. Final Clinical Impressions(s) / UC Diagnoses   Final diagnoses:  Essential hypertension  Encounter for medication refill     Discharge Instructions     Take blood pressure medication once daily. If you develop sleepiness from the medication you may take it before bedtime: It is most important that you are taking at the same time every day. Important to establish care with PCP next door for further management and routine health screenings. Go to ER for chest pain, difficulty breathing, severe abdominal pain, dark/blood in urine.    ED Prescriptions    Medication Sig Dispense Auth. Provider   amLODipine (NORVASC) 5 MG tablet Take 1 tablet (5 mg total) by mouth daily. 30 tablet Hall-Potvin, [place], PA-C     PDMP not reviewed this encounter.   Neldon Mc [place], [place] 09/25/19 1914

## 2019-09-25 NOTE — Discharge Instructions (Addendum)
Take blood pressure medication once daily. If you develop sleepiness from the medication you may take it before bedtime: It is most important that you are taking at the same time every day. Important to establish care with PCP next door for further management and routine health screenings. Go to ER for chest pain, difficulty breathing, severe abdominal pain, dark/blood in urine.

## 2019-09-25 NOTE — ED Triage Notes (Addendum)
Pt was at [facility] office with BP reading of 179/112, told to follow up. Pt denies symptoms. Has been on BP meds in the past, has not had in over a year.

## 2020-09-14 ENCOUNTER — Emergency Department (HOSPITAL_COMMUNITY): Payer: Self-pay

## 2020-09-14 ENCOUNTER — Emergency Department (HOSPITAL_COMMUNITY)
Admission: EM | Admit: 2020-09-14 | Discharge: 2020-09-14 | Disposition: A | Discharge status: Home / Self Care | Payer: Self-pay | Attending: Emergency Medicine | Admitting: Emergency Medicine

## 2020-09-14 DIAGNOSIS — Z91040 Latex allergy status: Secondary | ICD-10-CM | POA: Insufficient documentation

## 2020-09-14 DIAGNOSIS — R531 Weakness: Secondary | ICD-10-CM | POA: Insufficient documentation

## 2020-09-14 DIAGNOSIS — Z87891 Personal history of nicotine dependence: Secondary | ICD-10-CM | POA: Insufficient documentation

## 2020-09-14 DIAGNOSIS — I10 Essential (primary) hypertension: Secondary | ICD-10-CM | POA: Insufficient documentation

## 2020-09-14 DIAGNOSIS — G43809 Other migraine, not intractable, without status migrainosus: Secondary | ICD-10-CM | POA: Insufficient documentation

## 2020-09-14 DIAGNOSIS — G43409 Hemiplegic migraine, not intractable, without status migrainosus: Secondary | ICD-10-CM

## 2020-09-14 DIAGNOSIS — Z20822 Contact with and (suspected) exposure to covid-19: Secondary | ICD-10-CM | POA: Insufficient documentation

## 2020-09-14 DIAGNOSIS — R200 Anesthesia of skin: Secondary | ICD-10-CM | POA: Insufficient documentation

## 2020-09-14 LAB — I-STAT CHEM 8, ED
BUN: 9 mg/dL (ref 6–20)
Calcium, Ion: 1.23 mmol/L (ref 1.15–1.40)
Chloride: 105 mmol/L (ref 98–111)
Creatinine, Ser: 0.7 mg/dL (ref 0.44–1.00)
Glucose, Bld: 115 mg/dL — ABNORMAL HIGH (ref 70–99)
HCT: 32 % — ABNORMAL LOW (ref 36.0–46.0)
Hemoglobin: 10.9 g/dL — ABNORMAL LOW (ref 12.0–15.0)
Potassium: 3.7 mmol/L (ref 3.5–5.1)
Sodium: 142 mmol/L (ref 135–145)
TCO2: 24 mmol/L (ref 22–32)

## 2020-09-14 LAB — CBC
HCT: 31.6 % — ABNORMAL LOW (ref 36.0–46.0)
Hemoglobin: 9.5 g/dL — ABNORMAL LOW (ref 12.0–15.0)
MCH: 25.6 pg — ABNORMAL LOW (ref 26.0–34.0)
MCHC: 30.1 g/dL (ref 30.0–36.0)
MCV: 85.2 fL (ref 80.0–100.0)
Platelets: 449 10*3/uL — ABNORMAL HIGH (ref 150–400)
RBC: 3.71 MIL/uL — ABNORMAL LOW (ref 3.87–5.11)
RDW: 16.2 % — ABNORMAL HIGH (ref 11.5–15.5)
WBC: 9.4 10*3/uL (ref 4.0–10.5)
nRBC: 0 % (ref 0.0–0.2)

## 2020-09-14 LAB — DIFFERENTIAL
Abs Immature Granulocytes: 0.02 10*3/uL (ref 0.00–0.07)
Basophils Absolute: 0 10*3/uL (ref 0.0–0.1)
Basophils Relative: 0 %
Eosinophils Absolute: 0.2 10*3/uL (ref 0.0–0.5)
Eosinophils Relative: 2 %
Immature Granulocytes: 0 %
Lymphocytes Relative: 32 %
Lymphs Abs: 3 10*3/uL (ref 0.7–4.0)
Monocytes Absolute: 0.5 10*3/uL (ref 0.1–1.0)
Monocytes Relative: 6 %
Neutro Abs: 5.6 10*3/uL (ref 1.7–7.7)
Neutrophils Relative %: 60 %

## 2020-09-14 LAB — COMPREHENSIVE METABOLIC PANEL
ALT: 10 U/L (ref 0–44)
AST: 16 U/L (ref 15–41)
Albumin: 3.7 g/dL (ref 3.5–5.0)
Alkaline Phosphatase: 62 U/L (ref 38–126)
Anion gap: 6 (ref 5–15)
BUN: 8 mg/dL (ref 6–20)
CO2: 25 mmol/L (ref 22–32)
Calcium: 9.2 mg/dL (ref 8.9–10.3)
Chloride: 108 mmol/L (ref 98–111)
Creatinine, Ser: 0.87 mg/dL (ref 0.44–1.00)
GFR, Estimated: >60 mL/min (ref >60)
Glucose, Bld: 121 mg/dL — ABNORMAL HIGH (ref 70–99)
Potassium: 3.7 mmol/L (ref 3.5–5.1)
Sodium: 139 mmol/L (ref 135–145)
Total Bilirubin: 0.2 mg/dL — ABNORMAL LOW (ref 0.3–1.2)
Total Protein: 7 g/dL (ref 6.5–8.1)

## 2020-09-14 LAB — PROTIME-INR
INR: 1.1 (ref 0.8–1.2)
Prothrombin Time: 13.8 seconds (ref 11.4–15.2)

## 2020-09-14 LAB — APTT: aPTT: 26 seconds (ref 24–36)

## 2020-09-14 LAB — CBG MONITORING, ED: Glucose-Capillary: 118 mg/dL — ABNORMAL HIGH (ref 70–99)

## 2020-09-14 LAB — RESP PANEL BY RT-PCR (FLU A&B, COVID) ARPGX2
Influenza A by PCR: NEGATIVE
Influenza B by PCR: NEGATIVE
SARS Coronavirus 2 by RT PCR: NEGATIVE

## 2020-09-14 LAB — I-STAT BETA HCG BLOOD, ED (MC, WL, AP ONLY): I-stat hCG, quantitative: <5 m[IU]/mL (ref <5)

## 2020-09-14 LAB — ETHANOL: Alcohol, Ethyl (B): <10 mg/dL (ref <10)

## 2020-09-14 MED ORDER — KETOROLAC TROMETHAMINE 15 MG/ML IJ SOLN
15.0000 mg | Freq: Once | INTRAMUSCULAR | Status: AC
  Administered 2020-09-14: 15 mg via INTRAVENOUS
  Filled 2020-09-14: qty 1

## 2020-09-14 MED ORDER — PROCHLORPERAZINE EDISYLATE 10 MG/2ML IJ SOLN
10.0000 mg | Freq: Once | INTRAMUSCULAR | Status: AC
  Administered 2020-09-14: 10 mg via INTRAVENOUS
  Filled 2020-09-14: qty 2

## 2020-09-14 MED ORDER — MAGNESIUM 400 MG PO CAPS: 400.0000 mg | ORAL_CAPSULE | Freq: Three times a day (TID) | ORAL | 0 refills | Status: DC | PRN

## 2020-09-14 MED ORDER — PROCHLORPERAZINE MALEATE 10 MG PO TABS: 10.0000 mg | ORAL_TABLET | Freq: Three times a day (TID) | ORAL | 0 refills | Status: DC | PRN

## 2020-09-14 MED ORDER — NAPROXEN 250 MG PO TABS: 250.0000 mg | ORAL_TABLET | Freq: Three times a day (TID) | ORAL | 0 refills | Status: DC | PRN

## 2020-09-14 MED ORDER — DIPHENHYDRAMINE HCL 50 MG/ML IJ SOLN
25.0000 mg | Freq: Once | INTRAMUSCULAR | Status: AC
  Administered 2020-09-14: 25 mg via INTRAVENOUS
  Filled 2020-09-14: qty 1

## 2020-09-14 MED ORDER — SODIUM CHLORIDE 0.9 % IV BOLUS
500.0000 mL | Freq: Once | INTRAVENOUS | Status: AC
  Administered 2020-09-14: 500 mL via INTRAVENOUS

## 2020-09-14 NOTE — Consult Note (Signed)
Neurology Consultation Reason for Consult: Code stroke Requesting Physician: Jaci Carrel  CC: Left-sided tingling and weakness with headache  History is obtained from: Patient, EMS and chart review  HPI: Leslie Mills is a 35 y.o. female with a past medical history significant for uncontrolled hypertension, currently seeking primary care physician assistance, not on any medications, and tubal ligation.  She reports she has been under a great deal of stress lately, with the father of her children having not been home since Friday.  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  She frequently has headaches, which are migrainous especially around her monthly cycle (and she associates them having been much worse since her tubal ligation).  These headaches are pounding in nature behind her bilateral eyes, associated with nausea, light and sound sensitivity, and relieved by laying in a dark room and resting.  She had been having this sort of headache today but then had generalized tingling which then lateralized to her left side subsequently and gradually resolved until it was just involving her fingers.  She also felt weaker on the left side.  Therefore EMS was activated and she was brought to the ED for further evaluation as a code stroke.  She notes that her blood pressures are often in the 180s to 200s systolics, and this is often associated with worsened headache.  LKW: 2130 tPA given?: No, advanced imaging negative for stroke Premorbid modified rankin scale:      0 - No symptoms.   ROS: All other review of systems was negative except as noted in the HPI.   Past Medical History:  Diagnosis Date  . Hypertension   . Lactose intolerance   . SVD (spontaneous vaginal delivery)    x 1  . Termination of pregnancy    Past Surgical History:  Procedure Laterality Date  . CESAREAN SECTION    . LAPAROSCOPIC  BILATERAL SALPINGECTOMY Bilateral 09/05/2013   Procedure: LAPAROSCOPIC BILATERAL SALPINGECTOMY;  Surgeon: Antionette Char, MD;  Location: [facility];  Service: Gynecology;  Laterality: Bilateral;   Current Outpatient Medications  Medication Instructions  . acetaminophen (TYLENOL) 1,000 mg, Oral, Every 6 hours PRN  . amLODipine (NORVASC) 5 mg, Oral, Daily  -She is currently seeking a primary care physician to take over prescribing her medications and has not been taking any blood pressure medications recently   Family History  Problem Relation Age of Onset  . Hypertension Mother   . Diabetes Father   . Diabetes Maternal Uncle   . Hypertension Maternal Grandmother   . Diabetes Paternal Grandmother     Social History:  reports that she quit smoking about 7 years ago. She has a 3.00 pack-year smoking history. She has never used smokeless tobacco. She reports that she does not drink alcohol and does not use drugs. She reports she is working on quitting  Exam: Current vital signs: BP (!) 169/101   Pulse 78   Temp 99 F (37.2 C)   Resp 20   Ht 5\' 7"  (1.702 m)   Wt 91.4 kg   SpO2 100%   BMI 31.56 kg/m  Vital signs in last 24 hours: Temp:  [98.9 F (37.2 C)-99 F (37.2 C)] 99 F (37.2 C) (05/17 0100) Pulse Rate:  [67-92] 78 (05/17 0130) Resp:  [15-20] 20 (05/17 0130) BP: (169-182)/(99-101) 169/101 (05/17 0130) SpO2:  [99 %-100 %] 100 % (05/17 0130) Weight:  [91.4 kg] 91.4 kg (  05/17 0040)   Physical Exam  Constitutional: Appears well-developed and well-nourished.  Psych: Affect flat, intermittently tearful, but cooperative Eyes: No scleral injection HENT: No oropharyngeal obstruction.  MSK: no joint deformities.  Cardiovascular: Normal rate and regular rhythm.  Respiratory: Effort normal, non-labored breathing GI: Soft.  No distension. There is no tenderness.  Skin: Warm dry and intact visible skin  Neuro: Mental Status: Patient is awake, alert, oriented to person,  place, month, year, and situation. Patient is able to give a clear and coherent history. No signs of aphasia or neglect Cranial Nerves: II: Visual Fields are full. Pupils are equal, round, and reactive to light.   III,IV, VI: EOMI without ptosis or diploplia.  V: Facial sensation is symmetric to temperature VII: Facial movement is symmetric.  VIII: hearing is intact to voice X: Uvula elevates symmetrically XI: Shoulder shrug is symmetric. XII: tongue is midline without atrophy or fasciculations.  Motor: Tone is normal. Bulk is normal.  She had some inconsistent drift of the left upper and left lower extremity, but was able to use all of her limbs equally for tasks such as transferring from scanner to bed. Sensory: Sensation is symmetric to light touch and temperature in the arms and legs.  She reported some tingling only in the left hand which was resolving even more after MRI to be only in her fingertips Deep Tendon Reflexes: 2+ and symmetric in the biceps and patellae.  Plantars: Toes are downgoing bilaterally.  Cerebellar: FNF and HKS are intact bilaterally  NIHSS total 4 Score breakdown: 1 point-drowsiness, 1 point- left upper extremity drift, 1 point-left lower extremity drift, 1 point- sensory change in the left    I have reviewed labs in epic and the results pertinent to this consultation are: Creatinine 0.7.  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   -Magnesium 400 mg oral every 8 hours -Please drink with liberal amounts of water, aim to urinate 6-8 times a day, and eat regular meals with normal salt intake -Continue for up to 3 days or until headache has fully resolved with 1 additional dose after headache has resolved -Preventative medication will likely be useful. Although the patient has not had frequent severe headaches, headache requiring ED visit is significant enough that preventative medication should be considered on an outpatient basis -Outpatient referral to neurology has been placed, patient should call [facility] if she does not receive a call within a week to schedule appointment (requested appointment in approximately 4-6 weeks) -Please keep a headache log to discuss with your neurologist on follow-up -Lifestyle measures that help reduce migraines including stress management, regular meals, regular sleep, regular aerobic exercise, and adequate fluid intake to achieve 6-8 urinations per day.  Diet can also play a role  and patient is encouraged to track potential dietary triggers  Patient counseled to avoid medication overuse headache not continuing these cocktails for more than 3 days.  Also generally counseled to avoid use of any 1 abortive medication for more than 3-4 doses per week (acetaminophen, ibuprofen, naproxen).   Brooke Dare MD-PhD [facility] [phone] Available 7 PM to 7 AM, outside of these hours please call Neurologist on call as listed on Amion.

## 2020-09-14 NOTE — Discharge Instructions (Addendum)
Continue migraine cocktails as follows every 8 hours:  -Naproxen 250 mg every 8 hours,  -Compazine 5 to 10 mg every 8 hours,  -Benadryl 12.5 to 25 mg every 8 hours.  -Magnesium 400 mg oral every 8 hours  -Please drink with liberal amounts of water, aim to urinate 6-8 times a day, and eat regular meals with normal salt intake  -Continue for up to 3 days or until headache has fully resolved with 1 additional dose after headache has resolved  -Outpatient referral to neurology has been placed, patient should call [facility] if she does not receive a call within a week to schedule appointment (requested appointment in approximately 4-6 weeks) -Please keep a headache log to discuss with your neurologist on follow-up -Lifestyle measures that help reduce migraines including stress management, regular meals, regular sleep, regular aerobic exercise, and adequate fluid intake to achieve 6-8 urinations per day. Diet can also play a role and patient is encouraged to track potential dietary triggers  -Avoid medication overuse headache not continuing these cocktails for more than 3 days. Also avoid use of any 1 abortive medication for more than 3-4 doses per week (acetaminophen, ibuprofen, naproxen).

## 2020-09-14 NOTE — ED Triage Notes (Signed)
Patient to ED as code stroke. Patient complains of headache, dizziness, and left sided weakness.

## 2020-09-14 NOTE — Code Documentation (Signed)
Paged for code stroke  Patient arrived at 0000 with left sided weakness, facial droop, dizziness  and headache.  Patient has hx of HTN and noncompliance with medications.  Also complained that earlier today had chest pain but is chest pain free at this time.  CT head negative and patient taken to MRI.   BP 182/100 HR92 02-99% CBG on arrival 118 [facility] 4

## 2020-09-14 NOTE — ED Provider Notes (Signed)
[facility] EMERGENCY DEPARTMENT Provider Note   CSN: [id number] Arrival date & time: 09/14/20  0000     History Chief Complaint  Patient presents with  . Code Stroke    Leslie Mills is a 35 y.o. female.  Patient presents to the emergency department for evaluation of left-sided weakness.  Patient arrives in the ER by ambulance from home.  A code stroke has been initiated in the field.  Patient reports that she bent over and then stood up, suddenly started to feel dizzy and then noticed the left side of her body was numb and weak.  She had difficulty moving her left arm.  In route to the hospital she developed a headache.  She does have a history of migraines.        Past Medical History:  Diagnosis Date  . Hypertension   . Lactose intolerance   . SVD (spontaneous vaginal delivery)    x 1  . Termination of pregnancy     Patient Active Problem List   Diagnosis Date Noted  . VBAC, delivered, current hospitalization 07/10/2013  . Normal delivery 07/10/2013  . Indication for care in labor or delivery 07/09/2013  . Essential hypertension antepartum 05/07/2013  . BV (bacterial vaginosis) 03/13/2013  . Echogenic intracardiac focus of fetus on prenatal ultrasound 02/14/2013  . Asymptomatic bacteriuria in pregnancy 12/06/2012  . Previous cesarean section 12/02/2012  . Supervision of other normal pregnancy 12/02/2012    Past Surgical History:  Procedure Laterality Date  . CESAREAN SECTION    . LAPAROSCOPIC BILATERAL SALPINGECTOMY Bilateral 09/05/2013   Procedure: LAPAROSCOPIC BILATERAL SALPINGECTOMY;  Surgeon: Antionette Char, MD;  Location: [facility];  Service: Gynecology;  Laterality: Bilateral;     OB History    Gravida  3   Para  2   Term  2   Preterm      AB  1   Living  2     SAB      IAB  1   Ectopic      Multiple      Live Births  2           Family History  Problem Relation Age of Onset  . Hypertension Mother   .  Diabetes Father   . Diabetes Maternal Uncle   . Hypertension Maternal Grandmother   . Diabetes Paternal Grandmother     Social History   Tobacco Use  . Smoking status: Former Smoker    Packs/day: 0.50    Years: 6.00    Pack years: 3.00    Quit date: 11/18/2012    Years since quitting: 7.8  . Smokeless tobacco: Never Used  Substance Use Topics  . Alcohol use: No  . Drug use: No    Home Medications Prior to Admission medications   Medication Sig Start Date End Date Taking? Authorizing Provider  acetaminophen (TYLENOL) 500 MG tablet Take 1,000 mg by mouth every 6 (six) hours as needed for mild pain or moderate pain.   Yes [provider]  Magnesium 400 MG CAPS Take 400 mg by mouth every 8 (eight) hours as needed (headache). 09/14/20  Yes Tajha Sammarco, Canary Brim, MD  naproxen (NAPROSYN) 250 MG tablet Take 1 tablet (250 mg total) by mouth 3 (three) times daily with meals as needed for headache. 09/14/20  Yes Jerman Tinnon, Canary Brim, MD  prochlorperazine (COMPAZINE) 10 MG tablet Take 1 tablet (10 mg total) by mouth every 8 (eight) hours as needed (headache). 09/14/20  Yes Zyan Mirkin, Canary Brimhristopher J, MD  amLODipine (NORVASC) 5 MG tablet Take 1 tablet (5 mg total) by mouth daily. Patient not taking: Reported on 09/14/2020 09/25/19   Hall-Potvin, [place], PA-C    Allergies    Lactose intolerance (gi), Milk-related compounds, and Latex  Review of Systems   Review of Systems  Neurological: Positive for weakness, numbness and headaches.  All other systems reviewed and are negative.   Physical Exam Updated Vital Signs BP (!) 117/103 (BP Location: Right Arm)   Pulse 76   Temp 98.3 F (36.8 C) (Oral)   Resp 19   Ht 5\' 7"  (1.702 m)   Wt 91.4 kg   SpO2 100%   BMI 31.56 kg/m   Physical Exam Vitals and nursing note reviewed.  Constitutional:      General: She is not in acute distress.    Appearance: Normal appearance. She is well-developed.  HENT:     Head: Normocephalic and  atraumatic.     Right Ear: Hearing normal.     Left Ear: Hearing normal.     Nose: Nose normal.  Eyes:     Conjunctiva/sclera: Conjunctivae normal.     Pupils: Pupils are equal, round, and reactive to light.  Cardiovascular:     Rate and Rhythm: Regular rhythm.     Heart sounds: S1 normal and S2 normal. No murmur heard. No friction rub. No gallop.   Pulmonary:     Effort: Pulmonary effort is normal. No respiratory distress.     Breath sounds: Normal breath sounds.  Chest:     Chest wall: No tenderness.  Abdominal:     General: Bowel sounds are normal.     Palpations: Abdomen is soft.     Tenderness: There is no abdominal tenderness. There is no guarding or rebound. Negative signs include Murphy's sign and McBurney's sign.     Hernia: No hernia is present.  Musculoskeletal:        General: Normal range of motion.     Cervical back: Normal range of motion and neck supple.  Skin:    General: Skin is warm and dry.     Findings: No rash.  Neurological:     Mental Status: She is alert and oriented to person, place, and time.     GCS: GCS eye subscore is 4. GCS verbal subscore is 5. GCS motor subscore is 6.     Cranial Nerves: No cranial nerve deficit.     Sensory: No sensory deficit.     Coordination: Coordination normal.  Psychiatric:        Speech: Speech normal.        Behavior: Behavior normal.        Thought Content: Thought content normal.     ED Results / Procedures / Treatments   Labs (all labs ordered are listed, but only abnormal results are displayed) Labs Reviewed  CBC - Abnormal; Notable for the following components:      Result Value   RBC 3.71 (*)    Hemoglobin 9.5 (*)    HCT 31.6 (*)    MCH 25.6 (*)    RDW 16.2 (*)    Platelets 449 (*)    All other components within normal limits  COMPREHENSIVE METABOLIC PANEL - Abnormal; Notable for the following components:   Glucose, Bld 121 (*)    Total Bilirubin 0.2 (*)    All other components within normal  limits  I-STAT CHEM 8, ED - Abnormal; Notable for the following components:  Glucose, Bld 115 (*)    Hemoglobin 10.9 (*)    HCT 32.0 (*)    All other components within normal limits  CBG MONITORING, ED - Abnormal; Notable for the following components:   Glucose-Capillary 118 (*)    All other components within normal limits  RESP PANEL BY RT-PCR (FLU A&B, COVID) ARPGX2  ETHANOL  PROTIME-INR  APTT  DIFFERENTIAL  RAPID URINE DRUG SCREEN, HOSP PERFORMED  URINALYSIS, ROUTINE W REFLEX MICROSCOPIC  I-STAT BETA HCG BLOOD, ED ([facility], [facility], [facility] ONLY)    EKG None  Radiology MR ANGIO HEAD WO CONTRAST  Result Date: 09/14/2020 CLINICAL DATA:  Left-sided weakness. EXAM: MRI HEAD WITHOUT CONTRAST MRA HEAD WITHOUT CONTRAST TECHNIQUE: Multiplanar, multi-echo pulse sequences of the brain and surrounding structures were acquired without intravenous contrast. Angiographic images of the Circle of Willis were acquired using MRA technique without intravenous contrast. COMPARISON: No pertinent prior exam. COMPARISON:  Head CT same day FINDINGS: MRI HEAD FINDINGS Brain: No acute infarct, mass effect or extra-axial collection. No acute or chronic hemorrhage. Normal white matter signal, parenchymal volume and CSF spaces. The midline structures are normal. Vascular: Major flow voids are preserved. Skull and upper cervical spine: Normal calvarium and skull base. Visualized upper cervical spine and soft tissues are normal. Sinuses/Orbits:Fronto ethmoid sinusitis.  Normal orbits. MRA HEAD FINDINGS POSTERIOR CIRCULATION: --Vertebral arteries: Normal --Inferior cerebellar arteries: Normal. --Basilar artery: Normal. --Superior cerebellar arteries: Normal. --Posterior cerebral arteries: Normal. ANTERIOR CIRCULATION: --Intracranial internal carotid arteries: Normal. --Anterior cerebral arteries (ACA): Normal. --Middle cerebral arteries (MCA): Normal. ANATOMIC VARIANTS: Both posterior communicating arteries are patent. IMPRESSION:  Normal MRI/MRA of the brain. Electronically Signed   By: Deatra Robinson M.D.   On: 09/14/2020 00:59   MR BRAIN WO CONTRAST  Result Date: 09/14/2020 CLINICAL DATA:  Left-sided weakness. EXAM: MRI HEAD WITHOUT CONTRAST MRA HEAD WITHOUT CONTRAST TECHNIQUE: Multiplanar, multi-echo pulse sequences of the brain and surrounding structures were acquired without intravenous contrast. Angiographic images of the Circle of Willis were acquired using MRA technique without intravenous contrast. COMPARISON: No pertinent prior exam. COMPARISON:  Head CT same day FINDINGS: MRI HEAD FINDINGS Brain: No acute infarct, mass effect or extra-axial collection. No acute or chronic hemorrhage. Normal white matter signal, parenchymal volume and CSF spaces. The midline structures are normal. Vascular: Major flow voids are preserved. Skull and upper cervical spine: Normal calvarium and skull base. Visualized upper cervical spine and soft tissues are normal. Sinuses/Orbits:Fronto ethmoid sinusitis.  Normal orbits. MRA HEAD FINDINGS POSTERIOR CIRCULATION: --Vertebral arteries: Normal --Inferior cerebellar arteries: Normal. --Basilar artery: Normal. --Superior cerebellar arteries: Normal. --Posterior cerebral arteries: Normal. ANTERIOR CIRCULATION: --Intracranial internal carotid arteries: Normal. --Anterior cerebral arteries (ACA): Normal. --Middle cerebral arteries (MCA): Normal. ANATOMIC VARIANTS: Both posterior communicating arteries are patent. IMPRESSION: Normal MRI/MRA of the brain. Electronically Signed   By: Deatra Robinson M.D.   On: 09/14/2020 00:59   CT HEAD CODE STROKE WO CONTRAST  Addendum Date: 09/14/2020   ADDENDUM REPORT: 09/14/2020 00:31 ADDENDUM: Frontal and ethmoid sinus mucosal thickening. Electronically Signed   By: Deatra Robinson M.D.   On: 09/14/2020 00:31   Result Date: 09/14/2020 CLINICAL DATA:  Code stroke. Left-sided weakness with facial droop and headache EXAM: CT HEAD WITHOUT CONTRAST TECHNIQUE: Contiguous axial  images were obtained from the base of the skull through the vertex without intravenous contrast. COMPARISON:  None. FINDINGS: Brain: There is no mass, hemorrhage or extra-axial collection. The size and configuration of the ventricles and extra-axial CSF spaces are normal. The brain parenchyma  is normal, without evidence of acute or chronic infarction. Vascular: No abnormal hyperdensity of the major intracranial arteries or dural venous sinuses. No intracranial atherosclerosis. Skull: The visualized skull base, calvarium and extracranial soft tissues are normal. Sinuses/Orbits: No fluid levels or advanced mucosal thickening of the visualized paranasal sinuses. No mastoid or middle ear effusion. The orbits are normal. ASPECTS [employer] [facility] Stroke Program Early CT Score) - Ganglionic level infarction (caudate, lentiform nuclei, internal capsule, insula, M1-M3 cortex): 7 - Supraganglionic infarction (M4-M6 cortex): 3 Total score (0-10 with 10 being normal): 10 IMPRESSION: 1. Normal head CT. 2. ASPECTS is 10. These results were communicated to Dr. Brooke Dare at 12:20 am on 09/14/2020 by text page via the [facility] messaging system. Electronically Signed: By: Deatra Robinson M.D. On: 09/14/2020 00:21    Procedures Procedures   Medications Ordered in ED Medications  ketorolac (TORADOL) 15 MG/ML injection 15 mg (15 mg Intravenous Given 09/14/20 0152)  sodium chloride 0.9 % bolus 500 mL (0 mLs Intravenous Stopped 09/14/20 0317)  prochlorperazine (COMPAZINE) injection 10 mg (10 mg Intravenous Given 09/14/20 0158)  diphenhydrAMINE (BENADRYL) injection 25 mg (25 mg Intravenous Given 09/14/20 0158)    ED Course  I have reviewed the triage vital signs and the nursing notes.  Pertinent labs & imaging results that were available during my care of the patient were reviewed by me and considered in my medical decision making (see chart for details).    MDM Rules/Calculators/A&P                          Patient arrived to  the emergency department by ambulance as a code stroke after having sudden onset of left-sided numbness and weakness.  Symptoms improving at arrival.  Work-up has been very reassuring.  Normal MRIs.  Symptoms are completely resolved.  Suspect complicated migraine.  Discharged with instructions and neurology follow-up. Final Clinical Impression(s) / ED Diagnoses Final diagnoses:  Other migraine without status migrainosus, not intractable    Rx / DC Orders ED Discharge Orders         Ordered    Ambulatory referral to Neurology       Comments: An appointment is requested in approximately: 4 - 6 weeks   09/14/20 0150    naproxen (NAPROSYN) 250 MG tablet  3 times daily with meals PRN        09/14/20 0410    prochlorperazine (COMPAZINE) 10 MG tablet  Every 8 hours PRN        09/14/20 0410    Magnesium 400 MG CAPS  Every 8 hours PRN        09/14/20 0410           Gilda Crease, MD 09/14/20 [phone]

## 2021-02-14 ENCOUNTER — Ambulatory Visit
Admission: EM | Admit: 2021-02-14 | Discharge: 2021-02-14 | Disposition: A | Discharge status: Home / Self Care | Payer: Self-pay | Attending: Internal Medicine | Admitting: Internal Medicine

## 2021-02-14 ENCOUNTER — Encounter: Payer: Self-pay | Admitting: Emergency Medicine

## 2021-02-14 ENCOUNTER — Other Ambulatory Visit: Payer: Self-pay

## 2021-02-14 DIAGNOSIS — I10 Essential (primary) hypertension: Secondary | ICD-10-CM

## 2021-02-14 MED ORDER — AMLODIPINE BESYLATE 5 MG PO TABS: 5.0000 mg | ORAL_TABLET | Freq: Every day | ORAL | 1 refills | Status: DC

## 2021-02-14 NOTE — ED Triage Notes (Signed)
Patient is requesting a refill on Norvasc.  Patient just got insurance and will be able to follow up with PCP.  History of HTN.  Some headache.

## 2021-02-14 NOTE — Discharge Instructions (Addendum)
Your blood pressure medication has been refilled.  Please restart this medication as soon as possible.  Go to the hospital if headache worsens.  Primary care assistance has been requested for you.  Someone will reach out to you and set up an appointment.

## 2021-02-14 NOTE — ED Provider Notes (Signed)
[name]    CSN: [id number] Arrival date & time: 02/14/21  1650      History   Chief Complaint Chief Complaint  Patient presents with   Headache    HPI Leslie Mills is a 35 y.o. female.   Patient is requesting medication refill for amlodipine.  Patient reports that she recently got new insurance and is able to follow-up with primary care at this time.  Patient was seen in May 2021 and was prescribed amlodipine.  Patient reports that she took all medication that was prescribed including refills but was not able to get in with primary care or get refills due to not having any insurance after medication ran out.  Patient has been out of this medication since this occurred.  Denies chest pain, shortness of breath, nausea, vomiting, dizziness, blurred vision.  Has had mild headache but attributes this to her menstrual cycle as these headaches do correlate with this.  Patient reports that she tolerated the amlodipine well.  Patient did take her blood pressure medication while taking the amlodipine and reports systolic blood pressures in the 130s while on amlodipine.   Headache  Past Medical History:  Diagnosis Date   Hypertension    Lactose intolerance    SVD (spontaneous vaginal delivery)    x 1   Termination of pregnancy     Patient Active Problem List   Diagnosis Date Noted   VBAC, delivered, current hospitalization 07/10/2013   Normal delivery 07/10/2013   Indication for care in labor or delivery 07/09/2013   Essential hypertension antepartum 05/07/2013   BV (bacterial vaginosis) 03/13/2013   Echogenic intracardiac focus of fetus on prenatal ultrasound 02/14/2013   Asymptomatic bacteriuria in pregnancy 12/06/2012   Previous cesarean section 12/02/2012   Supervision of other normal pregnancy 12/02/2012    Past Surgical History:  Procedure Laterality Date   CESAREAN SECTION     LAPAROSCOPIC BILATERAL SALPINGECTOMY Bilateral 09/05/2013   Procedure:  LAPAROSCOPIC BILATERAL SALPINGECTOMY;  Surgeon: Antionette Char, MD;  Location: [facility];  Service: Gynecology;  Laterality: Bilateral;    OB History     Gravida  3   Para  2   Term  2   Preterm      AB  1   Living  2      SAB      IAB  1   Ectopic      Multiple      Live Births  2            Home Medications    Prior to Admission medications   Medication Sig Start Date End Date Taking? Authorizing Provider  acetaminophen (TYLENOL) 500 MG tablet Take 1,000 mg by mouth every 6 (six) hours as needed for mild pain or moderate pain.   Yes [provider]  amLODipine (NORVASC) 5 MG tablet Take 1 tablet (5 mg total) by mouth daily. 02/14/21   Lance Muss, FNP  Magnesium 400 MG CAPS Take 400 mg by mouth every 8 (eight) hours as needed (headache). 09/14/20   Gilda Crease, MD  naproxen (NAPROSYN) 250 MG tablet Take 1 tablet (250 mg total) by mouth 3 (three) times daily with meals as needed for headache. 09/14/20   Pollina, Canary Brim, MD  prochlorperazine (COMPAZINE) 10 MG tablet Take 1 tablet (10 mg total) by mouth every 8 (eight) hours as needed (headache). 09/14/20   Gilda Crease, MD    Family History Family History  Problem Relation  Age of Onset   Hypertension Mother    Diabetes Father    Diabetes Maternal Uncle    Hypertension Maternal Grandmother    Diabetes Paternal Grandmother     Social History Social History   Tobacco Use   Smoking status: Former    Packs/day: 0.50    Years: 6.00    Pack years: 3.00    Types: Cigarettes    Quit date: 11/18/2012    Years since quitting: 8.2   Smokeless tobacco: Never  Substance Use Topics   Alcohol use: No   Drug use: No     Allergies   Lactose intolerance (gi), Milk-related compounds, and Latex   Review of Systems Review of Systems Per HPI  Physical Exam Triage Vital Signs ED Triage Vitals  Enc Vitals Group     BP 02/14/21 1842 (!) 173/126     Pulse Rate 02/14/21  1842 81     Resp 02/14/21 1842 20     Temp 02/14/21 1842 98.3 F (36.8 C)     Temp Source 02/14/21 1842 Oral     SpO2 02/14/21 1842 97 %     Weight 02/14/21 1843 190 lb (86.2 kg)     Height 02/14/21 1843 5\' 3"  (1.6 m)     Head Circumference --      Peak Flow --      Pain Score 02/14/21 1842 4     Pain Loc --      Pain Edu? --      Excl. in GC? --    No data found.  Updated Vital Signs BP (!) 173/126 (BP Location: Left Arm)   Pulse 81   Temp 98.3 F (36.8 C) (Oral)   Resp 20   Ht 5\' 3"  (1.6 m)   Wt 190 lb (86.2 kg)   LMP 02/05/2021   SpO2 97%   BMI 33.66 kg/m   Visual Acuity Right Eye Distance:   Left Eye Distance:   Bilateral Distance:    Right Eye Near:   Left Eye Near:    Bilateral Near:     Physical Exam Constitutional:      General: She is not in acute distress.    Appearance: Normal appearance. She is not toxic-appearing or diaphoretic.  HENT:     Head: Normocephalic and atraumatic.  Eyes:     Extraocular Movements: Extraocular movements intact.     Conjunctiva/sclera: Conjunctivae normal.     Pupils: Pupils are equal, round, and reactive to light.  Cardiovascular:     Rate and Rhythm: Normal rate and regular rhythm.     Pulses: Normal pulses.     Heart sounds: Normal heart sounds.  Pulmonary:     Effort: Pulmonary effort is normal. No respiratory distress.     Breath sounds: Normal breath sounds.  Skin:    General: Skin is warm and dry.  Neurological:     General: No focal deficit present.     Mental Status: She is alert and oriented to person, place, and time. Mental status is at baseline.     Cranial Nerves: Cranial nerves are intact.     Sensory: Sensation is intact.     Motor: Motor function is intact.     Coordination: Coordination is intact.     Gait: Gait is intact.  Psychiatric:        Mood and Affect: Mood normal.        Behavior: Behavior normal.        Thought Content:  Thought content normal.        Judgment: Judgment normal.      UC Treatments / Results  Labs (all labs ordered are listed, but only abnormal results are displayed) Labs Reviewed - No data to display  EKG   Radiology No results found.  Procedures Procedures (including critical care time)  Medications Ordered in UC Medications - No data to display  Initial Impression / Assessment and Plan / UC Course  I have reviewed the triage vital signs and the nursing notes.  Pertinent labs & imaging results that were available during my care of the patient were reviewed by me and considered in my medical decision making (see chart for details).     Will refill amlodipine due to successful treatment of blood pressure and patient tolerating this medication well in the past.  PCP assistance requested for patient.  Advised patient of the importance of following up with PCP for further evaluation and management of blood pressure.  Patient was offered blood work but declined.  Neuro exam was normal and no red flags seen on exam.  No signs of hypertensive urgency at this time.Discussed strict return precautions. Patient verbalized understanding and is agreeable with plan.  Final Clinical Impressions(s) / UC Diagnoses   Final diagnoses:  Essential hypertension     Discharge Instructions      Your blood pressure medication has been refilled.  Please restart this medication as soon as possible.  Go to the hospital if headache worsens.  Primary care assistance has been requested for you.  Someone will reach out to you and set up an appointment.     ED Prescriptions     Medication Sig Dispense Auth. Provider   amLODipine (NORVASC) 5 MG tablet Take 1 tablet (5 mg total) by mouth daily. 30 tablet Lance Muss, FNP      PDMP not reviewed this encounter.   Lance Muss, FNP 02/14/21 1918

## 2021-08-03 ENCOUNTER — Encounter: Payer: Self-pay | Admitting: Emergency Medicine

## 2021-08-03 ENCOUNTER — Ambulatory Visit
Admission: EM | Admit: 2021-08-03 | Discharge: 2021-08-03 | Disposition: A | Discharge status: Home / Self Care | Payer: Self-pay | Attending: Family Medicine | Admitting: Family Medicine

## 2021-08-03 DIAGNOSIS — I10 Essential (primary) hypertension: Secondary | ICD-10-CM | POA: Insufficient documentation

## 2021-08-03 DIAGNOSIS — R350 Frequency of micturition: Secondary | ICD-10-CM | POA: Insufficient documentation

## 2021-08-03 DIAGNOSIS — N760 Acute vaginitis: Secondary | ICD-10-CM | POA: Insufficient documentation

## 2021-08-03 LAB — POCT URINALYSIS DIP (MANUAL ENTRY)
Bilirubin, UA: NEGATIVE
Glucose, UA: NEGATIVE mg/dL
Ketones, POC UA: NEGATIVE mg/dL
Leukocytes, UA: NEGATIVE
Nitrite, UA: NEGATIVE
Protein Ur, POC: 100 mg/dL — AB
Spec Grav, UA: >=1.03 — AB (ref 1.010–1.025)
Urobilinogen, UA: 1 E.U./dL
pH, UA: 6.5 (ref 5.0–8.0)

## 2021-08-03 MED ORDER — FLUCONAZOLE 150 MG PO TABS: 150.0000 mg | ORAL_TABLET | Freq: Once | ORAL | 0 refills | Status: AC

## 2021-08-03 MED ORDER — AMLODIPINE BESYLATE 5 MG PO TABS: 5.0000 mg | ORAL_TABLET | Freq: Every day | ORAL | 4 refills | Status: DC

## 2021-08-03 MED ORDER — METRONIDAZOLE 500 MG PO TABS: 500.0000 mg | ORAL_TABLET | Freq: Two times a day (BID) | ORAL | 0 refills | Status: AC

## 2021-08-03 NOTE — ED Provider Notes (Signed)
?[name] ? ? ? ?CSN: [id number] ?Arrival date & time: 08/03/21  1157 ? ? ?  ? ?History   ?Chief Complaint ?Chief Complaint  ?Patient presents with  ? Vaginal Itching  ? Medication Refill  ? ? ?HPI ?Leslie Mills is a 36 y.o. female.  ? ? ?Vaginal Itching ? ?Medication Refill ?Here for vaginal irritation/odor in the last few days. LMP was about 10 days ago. No abd pain or fever. No dysuria/hematuria.  ? ?She also is needing new rx of her amlodipine that she takes for bp. Will see a new primary care in August, earliest appt avail. ? ?Requests diflucan to go along with any antibiotic rx. ? ?Past Medical History:  ?Diagnosis Date  ? Hypertension   ? Lactose intolerance   ? SVD (spontaneous vaginal delivery)   ? x 1  ? Termination of pregnancy   ? ? ?Patient Active Problem List  ? Diagnosis Date Noted  ? VBAC, delivered, current hospitalization 07/10/2013  ? Normal delivery 07/10/2013  ? Indication for care in labor or delivery 07/09/2013  ? Essential hypertension antepartum 05/07/2013  ? BV (bacterial vaginosis) 03/13/2013  ? Echogenic intracardiac focus of fetus on prenatal ultrasound 02/14/2013  ? Asymptomatic bacteriuria in pregnancy 12/06/2012  ? Previous cesarean section 12/02/2012  ? Supervision of other normal pregnancy 12/02/2012  ? ? ?Past Surgical History:  ?Procedure Laterality Date  ? CESAREAN SECTION    ? LAPAROSCOPIC BILATERAL SALPINGECTOMY Bilateral 09/05/2013  ? Procedure: LAPAROSCOPIC BILATERAL SALPINGECTOMY;  Surgeon: Antionette CharLisa Jackson-Moore, MD;  Location: [facility];  Service: Gynecology;  Laterality: Bilateral;  ? ? ?OB History   ? ? Gravida  ?3  ? Para  ?2  ? Term  ?2  ? Preterm  ?   ? AB  ?1  ? Living  ?2  ?  ? ? SAB  ?   ? IAB  ?1  ? Ectopic  ?   ? Multiple  ?   ? Live Births  ?2  ?   ?  ?  ? ? ? ?Home Medications   ? ?Prior to Admission medications   ?Medication Sig Start Date End Date Taking? Authorizing Provider  ?fluconazole (DIFLUCAN) 150 MG tablet Take 1 tablet (150 mg total) by mouth  once for 1 dose. And can repeat in 3 days if need 08/03/21 08/03/21 Yes Raelyn Racette, Janace ArisPamela K, MD  ?metroNIDAZOLE (FLAGYL) 500 MG tablet Take 1 tablet (500 mg total) by mouth 2 (two) times daily for 7 days. 08/03/21 08/10/21 Yes Stephie Xu, Janace ArisPamela K, MD  ?acetaminophen (TYLENOL) 500 MG tablet Take 1,000 mg by mouth every 6 (six) hours as needed for mild pain or moderate pain.    [provider]  ?amLODipine (NORVASC) 5 MG tablet Take 1 tablet (5 mg total) by mouth daily. 08/03/21   Zenia ResidesBanister, Juanetta Negash K, MD  ?Magnesium 400 MG CAPS Take 400 mg by mouth every 8 (eight) hours as needed (headache). 09/14/20   Gilda CreasePollina, Christopher J, MD  ? ? ?Family History ?Family History  ?Problem Relation Age of Onset  ? Hypertension Mother   ? Diabetes Father   ? Diabetes Maternal Uncle   ? Hypertension Maternal Grandmother   ? Diabetes Paternal Grandmother   ? ? ?Social History ?Social History  ? ?Tobacco Use  ? Smoking status: Former  ?  Packs/day: 0.50  ?  Years: 6.00  ?  Pack years: 3.00  ?  Types: Cigarettes  ?  Quit date: 11/18/2012  ?  Years since  quitting: 8.7  ? Smokeless tobacco: Never  ?Substance Use Topics  ? Alcohol use: No  ? Drug use: No  ? ? ? ?Allergies   ?Lactose intolerance (gi), Milk-related compounds, and Latex ? ? ?Review of Systems ?Review of Systems ? ? ?Physical Exam ?Triage Vital Signs ?ED Triage Vitals  ?Enc Vitals Group  ?   BP 08/03/21 1323 (!) 173/92  ?   Pulse Rate 08/03/21 1323 75  ?   Resp 08/03/21 1323 18  ?   Temp 08/03/21 1323 98.4 ?F (36.9 ?C)  ?   Temp Source 08/03/21 1323 Oral  ?   SpO2 08/03/21 1323 98 %  ?   Weight --   ?   Height --   ?   Head Circumference --   ?   Peak Flow --   ?   Pain Score 08/03/21 1322 0  ?   Pain Loc --   ?   Pain Edu? --   ?   Excl. in [place]? --   ? ?No data found. ? ?Updated Vital Signs ?BP (!) 173/92   Pulse 75   Temp 98.4 ?F (36.9 ?C) (Oral)   Resp 18   LMP 07/25/2021   SpO2 98%  ? ?Visual Acuity ?Right Eye Distance:   ?Left Eye Distance:   ?Bilateral Distance:   ? ?Right  Eye Near:   ?Left Eye Near:    ?Bilateral Near:    ? ?Physical Exam ?Vitals reviewed.  ?Constitutional:   ?   General: She is not in acute distress. ?   Appearance: She is not ill-appearing, toxic-appearing or diaphoretic.  ?HENT:  ?   Mouth/Throat:  ?   Mouth: Mucous membranes are moist.  ?Eyes:  ?   Extraocular Movements: Extraocular movements intact.  ?   Pupils: Pupils are equal, round, and reactive to light.  ?Cardiovascular:  ?   Rate and Rhythm: Normal rate and regular rhythm.  ?   Heart sounds: No murmur heard. ?Pulmonary:  ?   Effort: Pulmonary effort is normal.  ?   Breath sounds: Normal breath sounds.  ?Abdominal:  ?   Palpations: Abdomen is soft.  ?   Tenderness: There is no abdominal tenderness. There is no guarding.  ?Musculoskeletal:  ?   Cervical back: Neck supple.  ?Lymphadenopathy:  ?   Cervical: No cervical adenopathy.  ?Skin: ?   Coloration: Skin is not jaundiced or pale.  ?Neurological:  ?   General: No focal deficit present.  ?   Mental Status: She is alert and oriented to person, place, and time.  ?Psychiatric:     ?   Behavior: Behavior normal.  ? ? ? ?UC Treatments / Results  ?Labs ?(all labs ordered are listed, but only abnormal results are displayed) ?Labs Reviewed  ?POCT URINALYSIS DIP (MANUAL ENTRY) - Abnormal; Notable for the following components:  ?    Result Value  ? Spec Grav, UA >=1.030 (*)   ? Blood, UA moderate (*)   ? Protein Ur, POC =100 (*)   ? All other components within normal limits  ?URINE CULTURE  ?CERVICOVAGINAL ANCILLARY ONLY  ? ? ?EKG ? ? ?Radiology ?No results found. ? ?Procedures ?Procedures (including critical care time) ? ?Medications Ordered in UC ?Medications - No data to display ? ?Initial Impression / Assessment and Plan / UC Course  ?I have reviewed the triage vital signs and the nursing notes. ? ?Pertinent labs & imaging results that were available during my care of the  patient were reviewed by me and considered in my medical decision making (see chart for  details). ? ?  ? ?Will treat empirically for BV, since she has had that before. Swab done for STI, and we will treat per protocol any positives. Rx for her amlodipine done to bridge her supply till she sees primary care ? ?UA showed some blood, so c/s sent ?Final Clinical Impressions(s) / UC Diagnoses  ? ?Final diagnoses:  ?Frequency of urination  ?Vaginitis and vulvovaginitis  ?Essential hypertension, benign  ? ? ? ?Discharge Instructions   ? ?  ? ? ?Take metronidazole 500 mg 1 tab twice daily with food for 7 days ? ?Staff will call you if any results are positive on the swab ? ?Urine culture was sent also ? ?Fluconazole 150 mg--take 1 tab if you begin having symptoms of a yeast infection also, and you can repeat in 3 days if need. ? ?Restart taking amlodipine 5 mg --1 tab daily for blood pressure ? ? ? ? ?ED Prescriptions   ? ? Medication Sig Dispense Auth. Provider  ? amLODipine (NORVASC) 5 MG tablet Take 1 tablet (5 mg total) by mouth daily. 30 tablet Kenadee Gates, Janace Aris, MD  ? metroNIDAZOLE (FLAGYL) 500 MG tablet Take 1 tablet (500 mg total) by mouth 2 (two) times daily for 7 days. 14 tablet Megin Consalvo, Janace Aris, MD  ? fluconazole (DIFLUCAN) 150 MG tablet Take 1 tablet (150 mg total) by mouth once for 1 dose. And can repeat in 3 days if need 2 tablet Cove Haydon, Janace Aris, MD  ? ?  ? ?PDMP not reviewed this encounter. ?  ?Zenia Resides, MD ?08/03/21 1407 ? ?

## 2021-08-03 NOTE — ED Triage Notes (Signed)
Pt is present today with vaginal irritation. Pt sx started x10 days . Pt needs a refill on blood pressure medication ?

## 2021-08-03 NOTE — Discharge Instructions (Addendum)
? ?  Take metronidazole 500 mg 1 tab twice daily with food for 7 days ? ?Staff will call you if any results are positive on the swab ? ?Urine culture was sent also ? ?Fluconazole 150 mg--take 1 tab if you begin having symptoms of a yeast infection also, and you can repeat in 3 days if need. ? ?Restart taking amlodipine 5 mg --1 tab daily for blood pressure ?

## 2021-08-04 LAB — CERVICOVAGINAL ANCILLARY ONLY
Bacterial Vaginitis (gardnerella): POSITIVE — AB
Candida Glabrata: NEGATIVE
Candida Vaginitis: NEGATIVE
Chlamydia: NEGATIVE
Comment: NEGATIVE
Comment: NEGATIVE
Comment: NEGATIVE
Comment: NEGATIVE
Comment: NEGATIVE
Comment: NORMAL
Neisseria Gonorrhea: NEGATIVE
Trichomonas: NEGATIVE

## 2021-08-04 LAB — URINE CULTURE

## 2021-12-13 ENCOUNTER — Other Ambulatory Visit: Payer: Self-pay

## 2021-12-13 ENCOUNTER — Encounter (HOSPITAL_COMMUNITY): Payer: Self-pay | Admitting: *Deleted

## 2021-12-13 ENCOUNTER — Emergency Department (HOSPITAL_COMMUNITY): Payer: No Typology Code available for payment source

## 2021-12-13 ENCOUNTER — Emergency Department (HOSPITAL_COMMUNITY)
Admission: EM | Admit: 2021-12-13 | Discharge: 2021-12-13 | Disposition: A | Discharge status: Home / Self Care | Payer: No Typology Code available for payment source | Attending: Emergency Medicine | Admitting: Emergency Medicine

## 2021-12-13 DIAGNOSIS — M25562 Pain in left knee: Secondary | ICD-10-CM | POA: Diagnosis not present

## 2021-12-13 DIAGNOSIS — Z79899 Other long term (current) drug therapy: Secondary | ICD-10-CM | POA: Insufficient documentation

## 2021-12-13 DIAGNOSIS — S99921A Unspecified injury of right foot, initial encounter: Secondary | ICD-10-CM | POA: Diagnosis present

## 2021-12-13 DIAGNOSIS — Y92410 Unspecified street and highway as the place of occurrence of the external cause: Secondary | ICD-10-CM | POA: Insufficient documentation

## 2021-12-13 DIAGNOSIS — Z91040 Latex allergy status: Secondary | ICD-10-CM | POA: Diagnosis not present

## 2021-12-13 DIAGNOSIS — I10 Essential (primary) hypertension: Secondary | ICD-10-CM | POA: Diagnosis not present

## 2021-12-13 DIAGNOSIS — M25561 Pain in right knee: Secondary | ICD-10-CM | POA: Diagnosis not present

## 2021-12-13 DIAGNOSIS — S9031XA Contusion of right foot, initial encounter: Secondary | ICD-10-CM | POA: Diagnosis not present

## 2021-12-13 MED ORDER — IBUPROFEN 200 MG PO TABS
600.0000 mg | ORAL_TABLET | Freq: Once | ORAL | Status: AC
  Administered 2021-12-13: 600 mg via ORAL
  Filled 2021-12-13: qty 1
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
## 2021-12-26 ENCOUNTER — Ambulatory Visit (HOSPITAL_COMMUNITY)
Admission: EM | Admit: 2021-12-26 | Discharge: 2021-12-26 | Disposition: A | Discharge status: Home / Self Care | Payer: Self-pay | Attending: Emergency Medicine | Admitting: Emergency Medicine

## 2021-12-26 ENCOUNTER — Ambulatory Visit: Payer: Self-pay

## 2021-12-26 ENCOUNTER — Encounter (HOSPITAL_COMMUNITY): Payer: Self-pay | Admitting: *Deleted

## 2021-12-26 DIAGNOSIS — M25561 Pain in right knee: Secondary | ICD-10-CM

## 2021-12-26 DIAGNOSIS — M79671 Pain in right foot: Secondary | ICD-10-CM

## 2021-12-26 DIAGNOSIS — M25512 Pain in left shoulder: Secondary | ICD-10-CM

## 2021-12-26 DIAGNOSIS — M25562 Pain in left knee: Secondary | ICD-10-CM

## 2021-12-26 MED ORDER — PREDNISONE 20 MG PO TABS: 40.0000 mg | ORAL_TABLET | Freq: Every day | ORAL | 0 refills | Status: DC

## 2021-12-26 MED ORDER — KETOROLAC TROMETHAMINE 30 MG/ML IJ SOLN: 30.0000 mg | Freq: Once | INTRAMUSCULAR | Status: DC

## 2021-12-26 MED ORDER — MELOXICAM 7.5 MG PO TABS: 7.5000 mg | ORAL_TABLET | Freq: Every day | ORAL | 0 refills | Status: DC

## 2021-12-26 MED ORDER — CYCLOBENZAPRINE HCL 10 MG PO TABS: 10.0000 mg | ORAL_TABLET | Freq: Every day | ORAL | 0 refills | Status: DC
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
## 2021-12-26 NOTE — Discharge Instructions (Addendum)
Your exam today all symptoms appear to be irritation to the muscles and tendons which will slowly improve with time  Today in the urgent care we can help to manage your discomfort  Starting tomorrow take prednisone every morning with food for the next 5 days, this medicine helps to reduce inflammation and irritation which ideally will help to relieve your discomfort, you may use Tylenol 500 to 1000 mg every 6 hours in addition to this medicine  Once prednisone is completed you may use meloxicam every morning as needed  May use muscle relaxer at bedtime as needed for additional comfort  You may use a form of compression whether that be a Ace wrap or brace to add stability and support to your foot while completing activities, may remove at rest  May use ice or heat over the affected area in 10 to 15-minute intervals  You may elevate your foot when sitting and lying for additional supportive measures  Please follow-up with orthopedics specialist for further evaluation and management, information is listed on the front of your paperwork

## 2021-12-29 ENCOUNTER — Ambulatory Visit: Payer: Self-pay | Admitting: Orthopaedic Surgery

## 2022-01-04 ENCOUNTER — Ambulatory Visit (INDEPENDENT_AMBULATORY_CARE_PROVIDER_SITE_OTHER): Payer: Self-pay | Admitting: Sports Medicine

## 2022-01-04 ENCOUNTER — Encounter: Payer: Self-pay | Admitting: Sports Medicine

## 2022-01-04 ENCOUNTER — Ambulatory Visit: Payer: Self-pay | Admitting: Sports Medicine

## 2022-01-04 ENCOUNTER — Ambulatory Visit (INDEPENDENT_AMBULATORY_CARE_PROVIDER_SITE_OTHER): Payer: Self-pay

## 2022-01-04 VITALS — BP 169/109 | HR 97

## 2022-01-04 DIAGNOSIS — M25512 Pain in left shoulder: Secondary | ICD-10-CM

## 2022-01-04 DIAGNOSIS — S46812A Strain of other muscles, fascia and tendons at shoulder and upper arm level, left arm, initial encounter: Secondary | ICD-10-CM

## 2022-01-04 DIAGNOSIS — M25561 Pain in right knee: Secondary | ICD-10-CM

## 2022-01-04 DIAGNOSIS — M79671 Pain in right foot: Secondary | ICD-10-CM

## 2022-01-04 DIAGNOSIS — S93601A Unspecified sprain of right foot, initial encounter: Secondary | ICD-10-CM

## 2022-01-04 DIAGNOSIS — M25562 Pain in left knee: Secondary | ICD-10-CM

## 2022-01-04 MED ORDER — MELOXICAM 7.5 MG PO TABS: 15.0000 mg | ORAL_TABLET | Freq: Every day | ORAL | 0 refills | Status: DC
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
## 2023-03-26 ENCOUNTER — Encounter: Payer: Self-pay | Admitting: *Deleted

## 2023-03-26 ENCOUNTER — Other Ambulatory Visit: Payer: Self-pay

## 2023-03-26 ENCOUNTER — Ambulatory Visit
Admission: EM | Admit: 2023-03-26 | Discharge: 2023-03-26 | Disposition: A | Discharge status: Home / Self Care | Payer: Self-pay | Attending: Physician Assistant | Admitting: Physician Assistant

## 2023-03-26 DIAGNOSIS — I10 Essential (primary) hypertension: Secondary | ICD-10-CM

## 2023-03-26 DIAGNOSIS — J039 Acute tonsillitis, unspecified: Secondary | ICD-10-CM

## 2023-03-26 LAB — POCT MONO SCREEN (KUC): Mono, POC: NEGATIVE

## 2023-03-26 LAB — POCT RAPID STREP A (OFFICE): Rapid Strep A Screen: NEGATIVE

## 2023-03-26 MED ORDER — AMLODIPINE BESYLATE 10 MG PO TABS
10.0000 mg | ORAL_TABLET | Freq: Every day | ORAL | 0 refills | Status: AC
Start: 2023-03-26 — End: ?

## 2023-03-26 MED ORDER — AMOXICILLIN-POT CLAVULANATE 875-125 MG PO TABS: 1.0000 | ORAL_TABLET | Freq: Two times a day (BID) | ORAL | 0 refills | Status: AC

## 2023-03-26 NOTE — Discharge Instructions (Signed)
You are negative for strep and mono.  Given the appearance of your tonsils we are going to start Augmentin twice daily for 7 days.  Gargle with warm salt water and use Tylenol/ibuprofen for pain relief.  If you are positive for mono and you just had a negative test today, you may develop a rash with the Augmentin.  If this happens please stop the medication to be seen immediately.  If your symptoms are not improving within a few days or if anything worsens and you have swelling of her throat, shortness of breath, muffled voice, high fever you need to be seen immediately.  I have refilled your amlodipine.  Start this daily.  Avoid decongestants, caffeine, sodium, NSAIDs (aspirin, ibuprofen/Advil, naproxen/Aleve).  Follow-up with primary care in January.  Monitor your blood pressure at home.  If this is persistently above 140/90 you can return here for medication adjustment.  If you develop any chest pain, shortness of breath, headache, vision change, dizziness in the setting of high blood pressure you need to be seen immediately.

## 2023-03-26 NOTE — ED Provider Notes (Signed)
[name]    CSN: [id number] Arrival date & time: 03/26/23  1251      History   Chief Complaint Chief Complaint  Patient presents with   Sore Throat    HPI Leslie Mills is a 37 y.o. female.   Patient presents today with a several day history of sore throat.  She reports sore throat is rated 4 on a 0-10 pain scale, described as soreness, no alleviating factors notified.  She has not tried any over-the-counter medication for symptom management.  She has had mild congestion but denies any cough, fever, chest pain, shortness of breath.  She denies any recent antibiotics or steroids.  She is eating and drinking normally.  Denies any swelling of her throat, shortness of breath, muffled voice.  She has no concern for pregnancy as she had a tubal ligation several years ago.  Patient has a history of hypertension but has been without her medication for several months.  She now has insurance and is scheduled to see a primary care provider in January 2025.  She denies any chest pain, shortness of breath, headache, vision change, dizziness.  She denies regular use of NSAIDs, decongestants, caffeine, sodium.  She is not monitoring her blood pressure at home.    Past Medical History:  Diagnosis Date   Hypertension    Lactose intolerance    SVD (spontaneous vaginal delivery)    x 1   Termination of pregnancy     Patient Active Problem List   Diagnosis Date Noted   VBAC, delivered, current hospitalization 07/10/2013   Normal delivery 07/10/2013   Indication for care in labor or delivery 07/09/2013   Essential hypertension antepartum 05/07/2013   BV (bacterial vaginosis) 03/13/2013   Echogenic intracardiac focus of fetus on prenatal ultrasound 02/14/2013   Asymptomatic bacteriuria in pregnancy 12/06/2012   Previous cesarean section 12/02/2012   Supervision of other normal pregnancy 12/02/2012    Past Surgical History:  Procedure Laterality Date   CESAREAN SECTION      LAPAROSCOPIC BILATERAL SALPINGECTOMY Bilateral 09/05/2013   Procedure: LAPAROSCOPIC BILATERAL SALPINGECTOMY;  Surgeon: Antionette Char, MD;  Location: [facility];  Service: Gynecology;  Laterality: Bilateral;    OB History     Gravida  3   Para  2   Term  2   Preterm      AB  1   Living  2      SAB      IAB  1   Ectopic      Multiple      Live Births  2            Home Medications    Prior to Admission medications   Medication Sig Start Date End Date Taking? Authorizing Provider  amLODipine (NORVASC) 10 MG tablet Take 1 tablet (10 mg total) by mouth daily. 03/26/23  Yes Glena Pharris K, PA-C  amoxicillin-clavulanate (AUGMENTIN) 875-125 MG tablet Take 1 tablet by mouth every 12 (twelve) hours. 03/26/23  Yes Sadye Kiernan, Noberto Retort, PA-C    Family History Family History  Problem Relation Age of Onset   Hypertension Mother    Diabetes Father    Diabetes Maternal Uncle    Hypertension Maternal Grandmother    Diabetes Paternal Grandmother     Social History Social History   Tobacco Use   Smoking status: Former    Current packs/day: 0.00    Average packs/day: 0.5 packs/day for 6.0 years (3.0 ttl pk-yrs)    Types: Cigarettes  Start date: 11/19/2006    Quit date: 11/18/2012    Years since quitting: 10.3   Smokeless tobacco: Never  Vaping Use   Vaping status: Some Days  Substance Use Topics   Alcohol use: No   Drug use: No     Allergies   Lactose intolerance (gi), Milk-related compounds, and Latex   Review of Systems Review of Systems  Constitutional:  Positive for activity change. Negative for appetite change, fatigue and fever.  HENT:  Positive for congestion and sore throat. Negative for sinus pressure and sneezing.   Eyes:  Negative for visual disturbance.  Respiratory:  Negative for cough and shortness of breath.   Cardiovascular:  Negative for chest pain.  Gastrointestinal:  Negative for abdominal pain, diarrhea, nausea and vomiting.   Neurological:  Negative for dizziness, light-headedness and headaches.     Physical Exam Triage Vital Signs ED Triage Vitals  Encounter Vitals Group     BP 03/26/23 1540 (!) 164/114     Systolic BP Percentile --      Diastolic BP Percentile --      Pulse Rate 03/26/23 1540 70     Resp 03/26/23 1540 16     Temp 03/26/23 1540 98.7 F (37.1 C)     Temp Source 03/26/23 1540 Oral     SpO2 03/26/23 1540 100 %     Weight --      Height --      Head Circumference --      Peak Flow --      Pain Score 03/26/23 1534 4     Pain Loc --      Pain Education --      Exclude from Growth Chart --    No data found.  Updated Vital Signs BP (!) 164/114 (BP Location: Left Arm)   Pulse 70   Temp 98.7 F (37.1 C) (Oral)   Resp 16   LMP 03/17/2023   SpO2 100%   Visual Acuity Right Eye Distance:   Left Eye Distance:   Bilateral Distance:    Right Eye Near:   Left Eye Near:    Bilateral Near:     Physical Exam Vitals reviewed.  Constitutional:      General: She is awake. She is not in acute distress.    Appearance: Normal appearance. She is well-developed. She is not ill-appearing.     Comments: Very pleasant female appears stated age in no acute distress sitting comfortably in exam room  HENT:     Head: Normocephalic and atraumatic.     Right Ear: Tympanic membrane, ear canal and external ear normal. Tympanic membrane is not erythematous or bulging.     Left Ear: Tympanic membrane, ear canal and external ear normal. Tympanic membrane is not erythematous or bulging.     Mouth/Throat:     Pharynx: Uvula midline. Posterior oropharyngeal erythema present. No oropharyngeal exudate.     Tonsils: Tonsillar exudate present. No tonsillar abscesses. 2+ on the right. 2+ on the left.  Cardiovascular:     Rate and Rhythm: Normal rate and regular rhythm.     Heart sounds: Normal heart sounds, S1 normal and S2 normal. No murmur heard. Pulmonary:     Effort: Pulmonary effort is normal.      Breath sounds: Normal breath sounds. No wheezing, rhonchi or rales.     Comments: Clear to auscultation bilaterally Lymphadenopathy:     Head:     Right side of head: No submental, submandibular or tonsillar  adenopathy.     Left side of head: No submental, submandibular or tonsillar adenopathy.     Cervical: No cervical adenopathy.  Psychiatric:        Behavior: Behavior is cooperative.      UC Treatments / Results  Labs (all labs ordered are listed, but only abnormal results are displayed) Labs Reviewed  POCT RAPID STREP A (OFFICE)  POCT MONO SCREEN ([facility])    EKG   Radiology No results found.  Procedures Procedures (including critical care time)  Medications Ordered in UC Medications - No data to display  Initial Impression / Assessment and Plan / UC Course  I have reviewed the triage vital signs and the nursing notes.  Pertinent labs & imaging results that were available during my care of the patient were reviewed by me and considered in my medical decision making (see chart for details).     Patient is well-appearing, afebrile, nontoxic, nontachycardic.  Strep testing was negative.  Mono testing was also negative.  Given significant exudate and erythema on exam will cover for tonsillitis with Augmentin twice daily for 7 days.  We did discuss that if she is falsely negative for mono in clinic she may develop a rash and if this happens she should stop the medication to be seen immediately.  She is to gargle with warm salt water and alternate Tylenol ibuprofen for pain.  We discussed that if she has any worsening or changing symptoms she needs to be seen immediately including swelling of her throat, shortness of breath, muffled voice.  Blood pressure is very elevated.  She is open to restarting her medications amlodipine 10 mg was sent to pharmacy.  She is to avoid decongestants, caffeine, sodium, NSAIDs.  Recommended that she monitor her blood pressure and if persistently  above 140/90 she can return here for medication adjustment until she is able to see primary care in January 2025.  If she develops any chest pain, shortness of breath, headache, vision change, dizziness in the setting of her blood pressure she needs to be seen immediately.  Strict return precautions given.  Work excuse note provided.  Final Clinical Impressions(s) / UC Diagnoses   Final diagnoses:  Exudative tonsillitis  Elevated blood pressure reading with diagnosis of hypertension     Discharge Instructions      You are negative for strep and mono.  Given the appearance of your tonsils we are going to start Augmentin twice daily for 7 days.  Gargle with warm salt water and use Tylenol/ibuprofen for pain relief.  If you are positive for mono and you just had a negative test today, you may develop a rash with the Augmentin.  If this happens please stop the medication to be seen immediately.  If your symptoms are not improving within a few days or if anything worsens and you have swelling of her throat, shortness of breath, muffled voice, high fever you need to be seen immediately.  I have refilled your amlodipine.  Start this daily.  Avoid decongestants, caffeine, sodium, NSAIDs (aspirin, ibuprofen/Advil, naproxen/Aleve).  Follow-up with primary care in January.  Monitor your blood pressure at home.  If this is persistently above 140/90 you can return here for medication adjustment.  If you develop any chest pain, shortness of breath, headache, vision change, dizziness in the setting of high blood pressure you need to be seen immediately.     ED Prescriptions     Medication Sig Dispense Auth. Provider   amLODipine (NORVASC) 10  MG tablet Take 1 tablet (10 mg total) by mouth daily. 90 tablet Clemons Salvucci K, PA-C   amoxicillin-clavulanate (AUGMENTIN) 875-125 MG tablet Take 1 tablet by mouth every 12 (twelve) hours. 14 tablet Kitti Mcclish, Noberto Retort, PA-C      PDMP not reviewed this encounter.    Jeani Hawking, PA-C 03/26/23 1638

## 2023-03-26 NOTE — ED Triage Notes (Signed)
Sore throat and states "felt warm" over the week-end. Boyfriend has similar symptoms

## 2023-04-06 ENCOUNTER — Telehealth: Payer: Self-pay

## 2023-04-06 MED ORDER — DIFLUCAN 150 MG PO TABS: ORAL_TABLET | ORAL | 0 refills | Status: DC

## 2023-04-06 NOTE — Telephone Encounter (Signed)
Patient walked into clinic to see if the provider can send her diflucan prescription since she developed a yeast infection after finishing her Augmentin antibiotic treatment prescribed 11/25 visit. OB/GYN status reviewed with her and she states last LMP 12/4, hx of tubal ligation. Pharmacy was reviewed. Consulted with provider Izola Price, [place]. She agrees to send her diflucan. Patient notified and verbalized understanding.

## 2023-04-08 ENCOUNTER — Telehealth: Payer: Self-pay

## 2023-04-08 NOTE — Telephone Encounter (Signed)
Original Call:  hi, mrn [phone] S. [place] just called, she was seen 11/25 and spoke to a nurse a couple days ago about getting a prescription for her new rising symptoms. Rebecca sent in a prescription but pt states [facility] not [facility] name brand and she's asking if we can send in a request for the generic. Phone number [phone]  Discussed the following with patient: "You guys sent a Rx for Diflucan and [facility] and [facility] states [facility] might need [facility] re[facility]written for Generic". "Having a hard time getting medication". Beginning day of last menses: 04-04-2023.   Called pharmacy for patient, verbal authorization given for generic to be filled.  Yancey Flemings CMA

## 2023-05-08 ENCOUNTER — Ambulatory Visit: Payer: Self-pay | Admitting: Family

## 2023-06-19 ENCOUNTER — Other Ambulatory Visit: Payer: Self-pay

## 2023-06-19 ENCOUNTER — Encounter (HOSPITAL_BASED_OUTPATIENT_CLINIC_OR_DEPARTMENT_OTHER): Payer: Self-pay | Admitting: Emergency Medicine

## 2023-06-19 ENCOUNTER — Emergency Department (HOSPITAL_BASED_OUTPATIENT_CLINIC_OR_DEPARTMENT_OTHER)
Admission: EM | Admit: 2023-06-19 | Discharge: 2023-06-19 | Disposition: A | Discharge status: Home / Self Care | Payer: Self-pay | Attending: Emergency Medicine | Admitting: Emergency Medicine

## 2023-06-19 DIAGNOSIS — Z91040 Latex allergy status: Secondary | ICD-10-CM | POA: Insufficient documentation

## 2023-06-19 DIAGNOSIS — L03312 Cellulitis of back [any part except buttock]: Secondary | ICD-10-CM | POA: Insufficient documentation

## 2023-06-19 MED ORDER — CLINDAMYCIN HCL 150 MG PO CAPS
300.0000 mg | ORAL_CAPSULE | Freq: Once | ORAL | Status: AC
  Administered 2023-06-19: 300 mg via ORAL
  Filled 2023-06-19: qty 2

## 2023-06-19 MED ORDER — CLINDAMYCIN HCL 300 MG PO CAPS: 300.0000 mg | ORAL_CAPSULE | Freq: Three times a day (TID) | ORAL | 0 refills | Status: AC

## 2023-06-19 NOTE — ED Triage Notes (Signed)
 C/o small bump on lower back. States could be either boil or spider bite. No hx of either. Denies fevers.

## 2023-06-19 NOTE — Discharge Instructions (Signed)
 You have a small area of cellulitis and likely had a infected bug bite  Please take clindamycin 3 times daily for a week  See your doctor for follow-up  Return to ER if you have worse redness or fever or purulent drainage

## 2023-06-19 NOTE — ED Provider Notes (Signed)
 [place] EMERGENCY DEPARTMENT AT [facility] Provider Note   CSN: [id number] Arrival date & time: 06/19/23  1625     History  Chief Complaint  Patient presents with   Abscess    Leslie Mills is a 38 y.o. female here presenting with swelling in the back.  She states that she may have a bug bite in her lower back and then became very painful for the last 3 to 4 days.  No history of MRSA or drug use.  Patient never had an abscess or cellulitis before.  Denies any fevers or chills  The history is provided by the patient.       Home Medications Prior to Admission medications   Medication Sig Start Date End Date Taking? Authorizing Provider  clindamycin (CLEOCIN) 300 MG capsule Take 1 capsule (300 mg total) by mouth 3 (three) times daily. 06/19/23  Yes Charlynne Pander, MD  amLODipine (NORVASC) 10 MG tablet Take 1 tablet (10 mg total) by mouth daily. 03/26/23   Raspet, Noberto Retort, PA-C  amoxicillin-clavulanate (AUGMENTIN) 875-125 MG tablet Take 1 tablet by mouth every 12 (twelve) hours. 03/26/23   Raspet, Erin K, PA-C  DIFLUCAN 150 MG tablet Take 1 tablet today. Repeat on day 3 if needed. 04/06/23   Tomi Bamberger, PA-C      Allergies    Lactose intolerance (gi), Milk-related compounds, and Latex    Review of Systems   Review of Systems  Skin:  Positive for rash.  All other systems reviewed and are negative.   Physical Exam Updated Vital Signs BP (!) 142/80 (BP Location: Left Arm)   Pulse 78   Temp 98.6 F (37 C) (Oral)   Resp 18   SpO2 99%  Physical Exam Vitals and nursing note reviewed.  Constitutional:      Appearance: Normal appearance.  HENT:     Head: Normocephalic.     Nose: Nose normal.     Mouth/Throat:     Mouth: Mucous membranes are moist.  Eyes:     Pupils: Pupils are equal, round, and reactive to light.  Cardiovascular:     Rate and Rhythm: Normal rate.     Pulses: Normal pulses.  Pulmonary:     Effort: Pulmonary effort is normal.   Abdominal:     General: Abdomen is flat.  Musculoskeletal:        General: Normal range of motion.     Cervical back: Normal range of motion.     Comments: Patient has erythema in the lower lumbar area.  No obvious fluctuance.  Skin:    Capillary Refill: Capillary refill takes less than 2 seconds.  Neurological:     General: No focal deficit present.     Mental Status: She is alert and oriented to person, place, and time.  Psychiatric:        Mood and Affect: Mood normal.        Behavior: Behavior normal.     ED Results / Procedures / Treatments   Labs (all labs ordered are listed, but only abnormal results are displayed) Labs Reviewed - No data to display  EKG None  Radiology No results found.  Procedures Procedures    EMERGENCY DEPARTMENT US SOFT TISSUE INTERPRETATION "Study: Limited Soft Tissue Ultrasound"  INDICATIONS: Pain Multiple views of the body part were obtained in real-time with a multi-frequency linear probe  PERFORMED BY: Myself IMAGES ARCHIVED?: No SIDE:Midline BODY PART:Lower back INTERPRETATION:  No abcess noted and Cellulitis present  Medications Ordered in ED Medications  clindamycin (CLEOCIN) capsule 300 mg (has no administration in time range)    ED Course/ Medical Decision Making/ A&P                                 Medical Decision Making AAREN KROG is a 38 y.o. female here presenting with possible cellulitis.  Patient may have an infected bug bite.  Bedside ultrasound did not show any deep abscess.  Patient will be given course of clindamycin to cover for strep and MRSA.   Problems Addressed: Cellulitis of back except buttock: acute illness or injury  Risk Prescription drug management.    Final Clinical Impression(s) / ED Diagnoses Final diagnoses:  None    Rx / DC Orders ED Discharge Orders          Ordered    clindamycin (CLEOCIN) 300 MG capsule  3 times daily        06/19/23 2036               Charlynne Pander, MD 06/19/23 2041

## 2023-07-16 ENCOUNTER — Encounter: Payer: Self-pay | Admitting: Family

## 2023-07-16 NOTE — Progress Notes (Signed)
 Erroneous encounter-disregard

## 2023-12-01 ENCOUNTER — Other Ambulatory Visit: Payer: Self-pay

## 2023-12-01 ENCOUNTER — Emergency Department (HOSPITAL_BASED_OUTPATIENT_CLINIC_OR_DEPARTMENT_OTHER)
Admission: EM | Admit: 2023-12-01 | Discharge: 2023-12-01 | Disposition: A | Discharge status: Home / Self Care | Attending: Emergency Medicine | Admitting: Emergency Medicine

## 2023-12-01 ENCOUNTER — Encounter (HOSPITAL_BASED_OUTPATIENT_CLINIC_OR_DEPARTMENT_OTHER): Payer: Self-pay | Admitting: *Deleted

## 2023-12-01 DIAGNOSIS — N760 Acute vaginitis: Secondary | ICD-10-CM | POA: Diagnosis not present

## 2023-12-01 DIAGNOSIS — B9689 Other specified bacterial agents as the cause of diseases classified elsewhere: Secondary | ICD-10-CM | POA: Diagnosis not present

## 2023-12-01 DIAGNOSIS — N3090 Cystitis, unspecified without hematuria: Secondary | ICD-10-CM | POA: Diagnosis not present

## 2023-12-01 DIAGNOSIS — R300 Dysuria: Secondary | ICD-10-CM | POA: Diagnosis present

## 2023-12-01 DIAGNOSIS — Z79899 Other long term (current) drug therapy: Secondary | ICD-10-CM | POA: Diagnosis not present

## 2023-12-01 DIAGNOSIS — I10 Essential (primary) hypertension: Secondary | ICD-10-CM | POA: Insufficient documentation

## 2023-12-01 DIAGNOSIS — Z91040 Latex allergy status: Secondary | ICD-10-CM | POA: Insufficient documentation

## 2023-12-01 LAB — WET PREP, GENITAL
Sperm: NONE SEEN
Trich, Wet Prep: NONE SEEN
WBC, Wet Prep HPF POC: >=10 — AB (ref <10)
Yeast Wet Prep HPF POC: NONE SEEN

## 2023-12-01 LAB — URINALYSIS, ROUTINE W REFLEX MICROSCOPIC
Bilirubin Urine: NEGATIVE
Glucose, UA: NEGATIVE mg/dL
Hgb urine dipstick: NEGATIVE
Ketones, ur: NEGATIVE mg/dL
Nitrite: NEGATIVE
Specific Gravity, Urine: 1.024 (ref 1.005–1.030)
WBC, UA: >50 WBC/hpf (ref 0–5)
pH: 7.5 (ref 5.0–8.0)

## 2023-12-01 LAB — PREGNANCY, URINE: Preg Test, Ur: NEGATIVE

## 2023-12-01 MED ORDER — CEPHALEXIN 500 MG PO CAPS: 500.0000 mg | ORAL_CAPSULE | Freq: Two times a day (BID) | ORAL | 0 refills | Status: AC

## 2023-12-01 MED ORDER — METRONIDAZOLE 500 MG PO TABS
2000.0000 mg | ORAL_TABLET | Freq: Once | ORAL | Status: AC
  Administered 2023-12-01: 2000 mg via ORAL
  Filled 2023-12-01: qty 4

## 2023-12-01 MED ORDER — DIFLUCAN 150 MG PO TABS: ORAL_TABLET | ORAL | 0 refills | Status: AC

## 2023-12-01 NOTE — ED Triage Notes (Addendum)
 Pt to ED reporting dysuria x 2 days, concerned for UTI. No fevers.   Patient also reporting vaginal itching and concerns for BV.

## 2023-12-01 NOTE — ED Provider Notes (Signed)
 [place] EMERGENCY DEPARTMENT AT [facility] Provider Note   CSN: [id number] Arrival date & time: 12/01/23  1756     Patient presents with: Dysuria   Leslie Mills is a 38 y.o. female presents with complaints of dysuria and vaginal discharge.  Patient states that symptoms have been ongoing for the past few days.  She denies any nausea or vomiting.  No flank pain.  No diarrhea.  She is sexually active but has been with the same partner.  She states she feels like she has a UTI and BV.    Dysuria  Past Medical History:  Diagnosis Date   Hypertension    Lactose intolerance    SVD (spontaneous vaginal delivery)    x 1   Termination of pregnancy        Prior to Admission medications   Medication Sig Start Date End Date Taking? Authorizing Provider  cephALEXin  (KEFLEX ) 500 MG capsule Take 1 capsule (500 mg total) by mouth 2 (two) times daily. 12/01/23  Yes Donnajean Lynwood DEL, PA-C  amLODipine  (NORVASC ) 10 MG tablet Take 1 tablet (10 mg total) by mouth daily. 03/26/23   Raspet, Erin K, PA-C  amoxicillin -clavulanate (AUGMENTIN ) 875-125 MG tablet Take 1 tablet by mouth every 12 (twelve) hours. 03/26/23   Raspet, Erin K, PA-C  clindamycin  (CLEOCIN ) 300 MG capsule Take 1 capsule (300 mg total) by mouth 3 (three) times daily. 06/19/23   Patt Alm Macho, MD  DIFLUCAN  150 MG tablet Take 1 tablet today. Repeat on day 3 if needed. 12/01/23   Donnajean Lynwood DEL, PA-C    Allergies: Lactose intolerance (gi), Milk-related compounds, and Latex    Review of Systems  Genitourinary:  Positive for dysuria.    Updated Vital Signs BP (!) 166/107   Pulse (!) 102   Temp 98.1 F (36.7 C)   Resp 16   LMP 11/12/2023   SpO2 100%   Physical Exam Vitals and nursing note reviewed.  Constitutional:      General: She is not in acute distress.    Appearance: She is well-developed.  HENT:     Head: Normocephalic and atraumatic.  Eyes:     Conjunctiva/sclera: Conjunctivae normal.   Cardiovascular:     Rate and Rhythm: Normal rate and regular rhythm.     Heart sounds: No murmur heard. Pulmonary:     Effort: Pulmonary effort is normal. No respiratory distress.     Breath sounds: Normal breath sounds.  Abdominal:     Palpations: Abdomen is soft.     Tenderness: There is abdominal tenderness.     Comments: Mild suprapubic abdominal tenderness, soft nondistended  Musculoskeletal:        General: No swelling.     Cervical back: Neck supple.  Skin:    General: Skin is warm and dry.     Capillary Refill: Capillary refill takes less than 2 seconds.  Neurological:     Mental Status: She is alert.  Psychiatric:        Mood and Affect: Mood normal.     (all labs ordered are listed, but only abnormal results are displayed) Labs Reviewed  WET PREP, GENITAL - Abnormal; Notable for the following components:      Result Value   Clue Cells Wet Prep HPF POC PRESENT (*)    WBC, Wet Prep HPF POC >=10 (*)    All other components within normal limits  URINALYSIS, ROUTINE W REFLEX MICROSCOPIC - Abnormal; Notable for the following components:  APPearance HAZY (*)    Protein, ur TRACE (*)    Leukocytes,Ua MODERATE (*)    Bacteria, UA RARE (*)    All other components within normal limits  PREGNANCY, URINE  GC/CHLAMYDIA PROBE AMP ([place]) NOT AT [facility]    EKG: None  Radiology: No results found.   Procedures   Medications Ordered in the ED  metroNIDAZOLE  (FLAGYL ) tablet 2,000 mg (has no administration in time range)                                    Medical Decision Making Amount and/or Complexity of Data Reviewed Labs: ordered.   This patient presents to the ED with chief complaint(s) of dysuria .  The complaint involves an extensive differential diagnosis and also carries with it a high risk of complications and morbidity.   Pertinent past medical history as listed in HPI  The differential diagnosis includes  UTI, BV, STI, pyelonephritis Additional  history obtained: Records reviewed Care Everywhere/External Records  Assessment and management:   Patient presents with complaints of dysuria and vaginal discharge for the past couple days.  Says she feels like she has a UTI and BV.  She has no increased urinary frequency.  She does have some suprapubic abdominal tenderness, negative CVAT.  Her UA is consistent with a UTI and wet prep is positive for BV.  She does not drink alcohol.  Will give one-time dose of metronidazole  here in the ED.  Will send in prescription for Keflex  to pharmacy to cover for UTI.  Have provided with strict return precautions.  She is hemodynamically stable and suitable for discharge.  Independent ECG interpretation:  none  Independent labs interpretation:  The following labs were independently interpreted:  Wet prep positive for clue cells, UA with moderate leukocytes, negative nitrites, > 50 WBCs, rare bacteria  Independent visualization and interpretation of imaging: I independently visualized the following imaging with scope of interpretation limited to determining acute life threatening conditions related to emergency care: none    Consultations obtained:   none  Disposition:   Patient will be discharged home. The patient has been appropriately medically screened and/or stabilized in the ED. I have low suspicion for any other emergent medical condition which would require further screening, evaluation or treatment in the ED or require inpatient management. At time of discharge the patient is hemodynamically stable and in no acute distress. I have discussed work-up results and diagnosis with patient and answered all questions. Patient is agreeable with discharge plan. We discussed strict return precautions for returning to the emergency department and they verbalized understanding.     Social Determinants of Health:   none  This note was dictated with voice recognition software.  Despite best efforts at  proofreading, errors may have occurred which can change the documentation meaning.       Final diagnoses:  Cystitis  BV (bacterial vaginosis)    ED Discharge Orders          Ordered    cephALEXin  (KEFLEX ) 500 MG capsule  2 times daily        12/01/23 1952    DIFLUCAN  150 MG tablet        12/01/23 1952               Donnajean Lynwood VEAR DEVONNA 12/01/23 1953    Armenta Canning, MD 12/02/23 1650

## 2023-12-01 NOTE — Discharge Instructions (Addendum)
 You were evaluated in the emergency room for painful urinating and vaginal discharge.  You are found to have a UTI and bacterial vaginosis.  You were given a dose of antibiotics to cover BV while here in the ED.  A prescription for antibiotics was sent into your pharmacy to cover for UTI.  Please be sure to complete the full course of antibiotics.  You are additionally provided an antifungal as well.

## 2023-12-03 LAB — GC/CHLAMYDIA PROBE AMP (~~LOC~~) NOT AT ARMC
Chlamydia: NEGATIVE
Comment: NEGATIVE
Comment: NORMAL
Neisseria Gonorrhea: NEGATIVE
